# Patient Record
Sex: Male | Born: 1955
Health system: Southern US, Community
[De-identification: ages and names within clinical notes are randomized; demographics above are authoritative.]

## PROBLEM LIST (undated history)

## (undated) DIAGNOSIS — N419 Inflammatory disease of prostate, unspecified: Secondary | ICD-10-CM

## (undated) DIAGNOSIS — T7840XA Allergy, unspecified, initial encounter: Secondary | ICD-10-CM

## (undated) DIAGNOSIS — Z8679 Personal history of other diseases of the circulatory system: Secondary | ICD-10-CM

## (undated) DIAGNOSIS — R300 Dysuria: Secondary | ICD-10-CM

## (undated) DIAGNOSIS — K5090 Crohn's disease, unspecified, without complications: Secondary | ICD-10-CM

## (undated) DIAGNOSIS — M1990 Unspecified osteoarthritis, unspecified site: Secondary | ICD-10-CM

## (undated) DIAGNOSIS — F419 Anxiety disorder, unspecified: Secondary | ICD-10-CM

## (undated) DIAGNOSIS — R002 Palpitations: Secondary | ICD-10-CM

## (undated) DIAGNOSIS — G4700 Insomnia, unspecified: Secondary | ICD-10-CM

## (undated) DIAGNOSIS — I10 Essential (primary) hypertension: Secondary | ICD-10-CM

## (undated) DIAGNOSIS — E7800 Pure hypercholesterolemia, unspecified: Secondary | ICD-10-CM

## (undated) DIAGNOSIS — I861 Scrotal varices: Secondary | ICD-10-CM

## (undated) DIAGNOSIS — N400 Enlarged prostate without lower urinary tract symptoms: Secondary | ICD-10-CM

## (undated) HISTORY — PX: INGUINAL HERNIA REPAIR: SUR1180

## (undated) HISTORY — DX: Scrotal varices: I86.1

## (undated) HISTORY — DX: Inflammatory disease of prostate, unspecified: N41.9

## (undated) HISTORY — DX: Unspecified osteoarthritis, unspecified site: M19.90

## (undated) HISTORY — DX: Personal history of other diseases of the circulatory system: Z86.79

## (undated) HISTORY — DX: Benign prostatic hyperplasia without lower urinary tract symptoms: N40.0

## (undated) HISTORY — DX: Crohn's disease, unspecified, without complications: K50.90

## (undated) HISTORY — DX: Allergy, unspecified, initial encounter: T78.40XA

## (undated) HISTORY — DX: Insomnia, unspecified: G47.00

## (undated) HISTORY — DX: Pure hypercholesterolemia, unspecified: E78.00

## (undated) HISTORY — DX: Essential (primary) hypertension: I10

## (undated) HISTORY — DX: Dysuria: R30.0

## (undated) HISTORY — DX: Anxiety disorder, unspecified: F41.9

## (undated) HISTORY — DX: Palpitations: R00.2

## (undated) HISTORY — PX: ACHILLES TENDON REPAIR: SUR1153

---

## 1997-09-11 ENCOUNTER — Ambulatory Visit (HOSPITAL_BASED_OUTPATIENT_CLINIC_OR_DEPARTMENT_OTHER): Admission: RE | Admit: 1997-09-11 | Discharge: 1997-09-11 | Payer: Self-pay | Admitting: Surgery

## 2004-04-22 ENCOUNTER — Ambulatory Visit (HOSPITAL_COMMUNITY): Admission: RE | Admit: 2004-04-22 | Discharge: 2004-04-22 | Payer: Self-pay | Admitting: *Deleted

## 2004-12-03 ENCOUNTER — Observation Stay (HOSPITAL_COMMUNITY): Admission: EM | Admit: 2004-12-03 | Discharge: 2004-12-04 | Payer: Self-pay

## 2006-01-19 ENCOUNTER — Ambulatory Visit: Payer: Self-pay | Admitting: Family Medicine

## 2006-03-20 ENCOUNTER — Ambulatory Visit: Payer: Self-pay | Admitting: Family Medicine

## 2006-04-12 ENCOUNTER — Ambulatory Visit: Payer: Self-pay | Admitting: Family Medicine

## 2006-08-14 ENCOUNTER — Ambulatory Visit: Payer: Self-pay | Admitting: Family Medicine

## 2006-09-17 ENCOUNTER — Ambulatory Visit: Payer: Self-pay | Admitting: Family Medicine

## 2007-01-16 ENCOUNTER — Encounter: Admission: RE | Admit: 2007-01-16 | Discharge: 2007-01-16 | Payer: Self-pay | Admitting: *Deleted

## 2007-05-16 ENCOUNTER — Ambulatory Visit: Payer: Self-pay | Admitting: Family Medicine

## 2007-09-26 ENCOUNTER — Ambulatory Visit: Payer: Self-pay | Admitting: Family Medicine

## 2007-10-29 ENCOUNTER — Inpatient Hospital Stay (HOSPITAL_COMMUNITY): Admission: EM | Admit: 2007-10-29 | Discharge: 2007-10-31 | Payer: Self-pay | Admitting: Emergency Medicine

## 2007-11-07 ENCOUNTER — Ambulatory Visit: Payer: Self-pay | Admitting: Family Medicine

## 2008-02-07 ENCOUNTER — Ambulatory Visit: Payer: Self-pay | Admitting: Family Medicine

## 2008-03-12 ENCOUNTER — Ambulatory Visit: Payer: Self-pay | Admitting: Family Medicine

## 2008-06-12 ENCOUNTER — Ambulatory Visit: Payer: Self-pay | Admitting: Family Medicine

## 2008-07-10 ENCOUNTER — Ambulatory Visit: Payer: Self-pay | Admitting: Family Medicine

## 2008-07-31 ENCOUNTER — Ambulatory Visit: Payer: Self-pay | Admitting: Family Medicine

## 2008-08-09 LAB — HM COLONOSCOPY: HM Colonoscopy: NORMAL

## 2008-09-15 ENCOUNTER — Ambulatory Visit: Payer: Self-pay | Admitting: Family Medicine

## 2008-11-16 ENCOUNTER — Ambulatory Visit: Payer: Self-pay | Admitting: Family Medicine

## 2008-11-24 ENCOUNTER — Encounter: Admission: RE | Admit: 2008-11-24 | Discharge: 2008-11-24 | Payer: Self-pay | Admitting: Gastroenterology

## 2008-11-30 ENCOUNTER — Ambulatory Visit: Payer: Self-pay | Admitting: Family Medicine

## 2009-01-11 ENCOUNTER — Ambulatory Visit: Payer: Self-pay | Admitting: Family Medicine

## 2009-03-25 ENCOUNTER — Ambulatory Visit: Payer: Self-pay | Admitting: Family Medicine

## 2009-04-20 ENCOUNTER — Ambulatory Visit: Payer: Self-pay | Admitting: Family Medicine

## 2009-05-19 ENCOUNTER — Ambulatory Visit: Payer: Self-pay | Admitting: Family Medicine

## 2009-06-15 ENCOUNTER — Ambulatory Visit: Payer: Self-pay | Admitting: Family Medicine

## 2009-09-20 ENCOUNTER — Ambulatory Visit: Payer: Self-pay | Admitting: Family Medicine

## 2009-09-24 ENCOUNTER — Ambulatory Visit: Payer: Self-pay | Admitting: Family Medicine

## 2009-11-23 ENCOUNTER — Ambulatory Visit: Payer: Self-pay | Admitting: Family Medicine

## 2010-06-15 ENCOUNTER — Other Ambulatory Visit (INDEPENDENT_AMBULATORY_CARE_PROVIDER_SITE_OTHER): Payer: BC Managed Care – PPO

## 2010-06-15 DIAGNOSIS — I10 Essential (primary) hypertension: Secondary | ICD-10-CM

## 2010-06-15 DIAGNOSIS — E785 Hyperlipidemia, unspecified: Secondary | ICD-10-CM

## 2010-07-05 NOTE — H&P (Signed)
NAMEMARQUAN, VOKES NO.:  [id number]   MEDICAL RECORD NO.:  [id number]          PATIENT TYPE:  EMS   LOCATION:  ED                           FACILITY:  [facility]   PHYSICIAN:  Rise Patience, MDDATE OF BIRTH:  [birth year]   DATE OF ADMISSION:  10/29/2007  DATE OF DISCHARGE:                              HISTORY & PHYSICAL   PRIMARY CARE PHYSICIAN:  Dr. Redmond School.   GASTROENTEROLOGIST:  Dr. Lajoyce Corners.   CHIEF COMPLAINT:  Abdominal pain.   HISTORY OF PRESENT ILLNESS:  A 55 year old male with history of Crohn  disease, hypertension, hyperlipidemia, depression.  Presently in the ER  complaining of abdominal pain.  Pain is more in the right lower quadrant  associated some nausea and vomiting over the last 2 days.  The pain is  constant, dull aching, nonradiating, has no associated diarrhea.  Denies  any blood in the vomitus.  Denies any fever, chills.  Denies any chest  pain, shortness of breath, weakness of limbs, loss of consciousness,  dizziness, dysuria, discharges.   PAST MEDICAL HISTORY:  1. Crohn disease.  2. Hypertension.  3. Hyperlipidemia.  4. Depression.   PAST SURGICAL HISTORY:  1. Right-sided surgery for Crohn disease.  Apparently, he he says his      terminal ileum was resected.  2. Inguinal hernia surgery.   MEDICATION PRIOR TO ADMISSION:  1. Asacol.  2. Paxil.  3. Takes blood pressure medicine which he does not remember. He says      it is a beta blocker.  4. Vytorin 10/20 p.o. daily.   ALLERGIES:  NO KNOWN DRUG ALLERGIES.   FAMILY HISTORY:  Noncontributory.   SOCIAL HISTORY:  The patient denies smoking cigarettes, drinking alcohol  or using illegal drugs.   REVIEW OF SYSTEMS:  As in history of present illness, nothing else  significant.   PHYSICAL EXAMINATION:  GENERAL:  The patient examined at bedside, not in  acute distress.  VITAL SIGNS:  Blood pressure is 144/78, pulse 90 per minute, temperature  97.6, respiration 18 per minute, O2  sat 96%.  HEENT:  Anicteric.  No pallor.  CHEST:  Bilateral air entry present.  No rhonchi, no crepitation.  HEART:  S1, S2 heard.  ABDOMEN:  There is mild tenderness in the right lower quadrant.  Bowel  sounds not appreciated.  No guarding or rigidity.  No discoloration.  CNS:  The patient is alert, awake,  and oriented to time, place and  person.  Moves upper and lower extremities 5/5.  EXTREMITIES:  Peripheral pulse are felt, no edema.   LABORATORY DATA:  Acute abdominal series shows mild reactive ileus  versus early small bowel obstruction.  No active cardiopulmonary  disease.  CBC:  WBC 17.1, hemoglobin 15.7, hematocrit 47.2, platelets  241, neutrophils 93%.  Basic metabolic panel:  Sodium 638, potassium 4,  chloride 106, bicarb 24, glucose 124, BUN 15, creatinine 0.9, calcium  9.4.  UA appears cloudy, small bilirubin, ketones 15, blood negative,  nitrites negative, leukocytes negative.   ASSESSMENT:  1. Mild reactive ileus versus small bowel obstruction probably from  Crohn disease exacerbation.  2. Hypertension.  3. Hyperlipidemia.  4. History of depression.  No suicidal ideation.   PLAN:  Admit the patient to medical floor.  Will continue with IV  fluids.  Keep the patient n.p.o.  Will place the patient on IV steroids,  IV antibiotics.  Get blood cultures.  Get a CAT scan of the abdomen and  pelvis and further recommendations as patient evolves.      Rise Patience, MD  Electronically Signed     ANK/MEDQ  D:  10/29/2007  T:  10/29/2007  Job:  [id number]

## 2010-07-07 ENCOUNTER — Other Ambulatory Visit: Payer: Self-pay | Admitting: Family Medicine

## 2010-07-08 NOTE — Assessment & Plan Note (Signed)
Shawn Norris                                 ON-CALL NOTE   Shawn Norris, Shawn Norris                    MRN:          [id number]  DATE:12/05/2008                            DOB:          02/20/1956    PHYSICIAN:  Dr. Carol Ada   Phone number [phone].   TIME:  10:00 a.m.   Shawn Norris calls this morning complaining of episodic abdominal pain  with vomiting for the past 24 hours.  He has a long history of small  bowel Crohn's disease and states he has had a surgical resection in the  past.  He has recently started seeing Dr. Benson Norway and states he underwent a  colonoscopy recently that was unremarkable.  He was maintained on Asacol  and this was stopped about 2 weeks ago.  He had intermittent crampy  abdominal pain associated with nausea and vomiting several times last  night and again this morning.  Right now, he is pain free without  vomiting.  There has been no bleeding, fevers, chills, melena or  hematochezia.  I suggested we evaluate him in an urgent care center or  the emergency room but he would like to treat this as he has in the past  with prednisone.  I have advised him to begin a clear liquid diet and  advance slowly over the next 2-3 days, begin prednisone 40 mg daily  today and Phenergan 12.5/25 mg q.6 h., p.r.n.  He has hydrocodone at  home for pain management.  If his symptoms worsen or do not respond, he  will call me back this weekend.  Otherwise, I have advised him to  contact Dr. Ulyses Amor office on Monday for further management advice.  He  will stay on 40 mg of prednisone until he has further direction from Dr.  Benson Norway.     Pricilla Riffle. Fuller Plan, MD, [facility]  Electronically Signed    MTS/MedQ  DD: 12/05/2008  DT: 12/05/2008  Job #: [id number]   cc:   Tory Emerald. Benson Norway, MD

## 2010-07-08 NOTE — Op Note (Signed)
NAMEJAIMESON, GOPAL              ACCOUNT NO.:  [id number]   MEDICAL RECORD NO.:  [id number]          PATIENT TYPE:  AMB   LOCATION:  ENDO                         FACILITY:  [facility]   PHYSICIAN:  Waverly Ferrari, M.D.    DATE OF BIRTH:  1955/11/04   DATE OF PROCEDURE:  DATE OF DISCHARGE:                                 OPERATIVE REPORT   PROCEDURE:  Colonoscopy.   INDICATIONS:  Crohn's disease and colon cancer screening.   ANESTHESIA:  Demerol 80 mg, Versed 9 mg.   PROCEDURE:  With the patient mildly sedated in the left lateral decubitus  position, a rectal examination was performed.  The prostate was normal and  was otherwise a normal rectal examination.  Subsequently, the Olympus  videoscopic colonoscope was inserted into the rectum and passed under direct  vision to the neocecum which was photographed.  The mucosa appeared normal.  From this point, the colonoscope was slowly withdrawn taking circumferential  views of the colonic mucosa stopping only in the rectum which appeared  normal on direct and retroflex views and also normal as we pulled through  the anal canal.  There were no fissures or fistula seen.  The patient's  vital signs and pulse oximeter remained stable.  The patient tolerated the  procedure well.  There were no apparent complications.   FINDINGS:  Normal examination to the neocecum because of his increased risk  of colon cancer.  We would screen him again in approximately three to five  years.      GMO/MEDQ  D:  04/22/2004  T:  04/22/2004  Job:  [id number]   cc:   Dr. Jenny Reichmann ________

## 2010-07-08 NOTE — H&P (Signed)
Shawn Norris, Shawn Norris              ACCOUNT NO.:  [id number]   MEDICAL RECORD NO.:  [id number]          PATIENT TYPE:  OBV   LOCATION:  1827                         FACILITY:  [facility]   PHYSICIAN:  Edythe Lynn, M.D.       DATE OF BIRTH:  07/09/1955   DATE OF ADMISSION:  12/02/2004  DATE OF DISCHARGE:                                HISTORY & PHYSICAL   PRIMARY CARE PHYSICIAN:  Dr. Redmond School   CHIEF COMPLAINT:  Abdominal pain.   HISTORY OF PRESENT ILLNESS:  Shawn Norris is a 55 year old male with long  history of Crohn's disease status post small-bowel resection years ago. The  patient is currently on no maintenance therapy and he has not had a flare in  about 5 years. The patient presents to [facility] emergency room with severe  onset of abdominal pain in the right lower quadrant with some nausea and  inability to pass gas.   PAST MEDICAL HISTORY:  1.  Crohn's disease status post small-bowel resection.  2.  Hypertension.  3.  Status post hernia repair.   FAMILY HISTORY:  Positive for Crohn's disease in a brother. Both parents are  alive and healthy. He has got a grandmother with some colitis.   SOCIAL HISTORY:  The patient does not smoke cigarettes. He occasionally  drinks alcohol. He works in [employer]. He is single, has no children.   REVIEW OF SYSTEMS:  Twelve systems tested and negative.   HOME MEDICATIONS:  1.  Diltiazem 360 mg per mouth daily.  2.  Paxil 10 mg per mouth daily.   PHYSICAL EXAMINATION UPON ADMISSION:  VITAL SIGNS:  Temperature of 97.6,  blood pressure 154/95, pulse 101, respirations 20, saturation 98% on room  air.  GENERAL:  This gentleman is well-developed, well-nourished, in no acute  distress.  HEENT:  Head is normocephalic, atraumatic. Eyes have pupils equal and round,  reactive to light.  NECK:  Supple without JVD.  LUNGS:  Clear to auscultation without wheezes, rhonchi, or crackles.  HEART:  Regular rate and rhythm without murmurs, rubs, or gallops.  ABDOMEN:  Soft, nontender, not distended. Bowel sounds are present. There is  some mild tenderness to deep palpation of the right lower quadrant but there  is no guarding and no rebound.  SKIN:  Warm and dry.  NEUROLOGIC:  Nonfocal. The patient appears to have intact insight, judgment,  memory, and affect.   LABORATORY VALUES AT THE TIME OF ADMISSION:  Show a white blood cell count  of 12,900; hemoglobin of 16.4; hematocrit 47.5; platelet count of 246;  absolute neutrophil count 11.6. Sodium 139, potassium 2.9, chloride 104,  bicarb 26, glucose 138, BUN of 14, creatinine 1.2, calcium 9.6, protein 7.2,  albumin 4.3, AST 32, ALT 25, alkaline phosphatase 45, bilirubin 0.7, lipase  16. CT scan of the abdomen with oral and IV contrast shows thickening of the  distal ileum and portion of the small bowel, no abscess, and no small-bowel  obstruction.   ASSESSMENT AND PLAN:  1.  Mild Crohn's disease flare. The plan is to start the patient on a taper  of prednisone orally and mesalamine at 4 g daily. If the patient      tolerates the regimen well today he is going to be able to be discharged      and to be followed up by Dr. Redmond School.  2.  Hypertension. Will continue the patient's Cardizem.  3.  Deep venous thrombosis and gastrointestinal prophylaxis will be      undertaken.      Edythe Lynn, M.D.  Electronically Signed     SL/MEDQ  D:  12/03/2004  T:  12/03/2004  Job:  [id number]   cc:   Jill Alexanders, M.D.  Fax: [phone]

## 2010-08-10 ENCOUNTER — Other Ambulatory Visit: Payer: Self-pay | Admitting: Family Medicine

## 2010-11-11 ENCOUNTER — Other Ambulatory Visit: Payer: Self-pay | Admitting: Family Medicine

## 2010-11-11 MED ORDER — ZOLPIDEM TARTRATE 10 MG PO TABS: 10.0000 mg | ORAL_TABLET | Freq: Every evening | ORAL | Status: DC | PRN

## 2010-11-11 NOTE — Telephone Encounter (Signed)
Called in Ambien 10 mg 1 po QHS prn #30 with no refills to [facility] at [phone] per JCL.  CM, LPN

## 2010-11-11 NOTE — Telephone Encounter (Signed)
Is this ok?

## 2010-11-11 NOTE — Telephone Encounter (Signed)
Call in the Ambien 

## 2010-11-23 LAB — BASIC METABOLIC PANEL
BUN: 12
BUN: 15
CO2: 24
CO2: 29
Calcium: 8.6
Calcium: 9.4
Chloride: 106
Chloride: 109
Creatinine, Ser: 0.95
Creatinine, Ser: 0.96
GFR calc Af Amer: >60
GFR calc Af Amer: >60
GFR calc non Af Amer: >60
GFR calc non Af Amer: >60
Glucose, Bld: 118 — ABNORMAL HIGH
Glucose, Bld: 124 — ABNORMAL HIGH
Potassium: 3.8
Potassium: 4
Sodium: 139
Sodium: 142

## 2010-11-23 LAB — URINALYSIS, ROUTINE W REFLEX MICROSCOPIC
Glucose, UA: NEGATIVE
Hgb urine dipstick: NEGATIVE
Ketones, ur: 15 — AB
Nitrite: NEGATIVE
Protein, ur: NEGATIVE
Specific Gravity, Urine: 1.03
Urobilinogen, UA: 0.2
pH: 5

## 2010-11-23 LAB — DIFFERENTIAL
Basophils Absolute: 0.1
Basophils Relative: 0
Eosinophils Absolute: 0
Eosinophils Relative: 0
Lymphocytes Relative: 4 — ABNORMAL LOW
Lymphs Abs: 0.7
Monocytes Absolute: 0.4
Monocytes Relative: 2 — ABNORMAL LOW
Neutro Abs: 15.9 — ABNORMAL HIGH
Neutrophils Relative %: 93 — ABNORMAL HIGH

## 2010-11-23 LAB — CBC
HCT: 38 — ABNORMAL LOW
HCT: 38.4 — ABNORMAL LOW
HCT: 43.4
HCT: 47.2
Hemoglobin: 12.9 — ABNORMAL LOW
Hemoglobin: 13
Hemoglobin: 14.8
Hemoglobin: 15.7
MCHC: 33.3
MCHC: 33.8
MCHC: 33.8
MCHC: 34
MCV: 88.8
MCV: 89.3
MCV: 89.4
MCV: 90
Platelets: 194
Platelets: 198
Platelets: 217
Platelets: 241
RBC: 4.25
RBC: 4.33
RBC: 4.85
RBC: 5.24
RDW: 12.6
RDW: 13.1
RDW: 13.1
RDW: 13.3
WBC: 14.3 — ABNORMAL HIGH
WBC: 17.1 — ABNORMAL HIGH
WBC: 6.7
WBC: 7.4

## 2010-11-23 LAB — COMPREHENSIVE METABOLIC PANEL
ALT: 14
AST: 18
Albumin: 3.5
Alkaline Phosphatase: 37 — ABNORMAL LOW
BUN: 12
CO2: 25
Calcium: 8.7
Chloride: 104
Creatinine, Ser: 0.89
GFR calc Af Amer: >60
GFR calc non Af Amer: >60
Glucose, Bld: 113 — ABNORMAL HIGH
Potassium: 3.8
Sodium: 136
Total Bilirubin: 0.9
Total Protein: 6.7

## 2010-11-23 LAB — CULTURE, BLOOD (ROUTINE X 2)
Culture: NO GROWTH
Culture: NO GROWTH

## 2010-11-23 LAB — GLUCOSE, CAPILLARY
Glucose-Capillary: 107 — ABNORMAL HIGH
Glucose-Capillary: 112 — ABNORMAL HIGH
Glucose-Capillary: 124 — ABNORMAL HIGH
Glucose-Capillary: 131 — ABNORMAL HIGH
Glucose-Capillary: 146 — ABNORMAL HIGH

## 2010-11-23 LAB — LIPASE, BLOOD: Lipase: 17

## 2010-12-19 ENCOUNTER — Other Ambulatory Visit (INDEPENDENT_AMBULATORY_CARE_PROVIDER_SITE_OTHER): Payer: BC Managed Care – PPO

## 2010-12-19 ENCOUNTER — Telehealth: Payer: Self-pay | Admitting: Family Medicine

## 2010-12-19 DIAGNOSIS — Z2911 Encounter for prophylactic immunotherapy for respiratory syncytial virus (RSV): Secondary | ICD-10-CM

## 2010-12-19 DIAGNOSIS — Z23 Encounter for immunization: Secondary | ICD-10-CM

## 2010-12-19 NOTE — Progress Notes (Signed)
Addended by: Christin Bach L on: 12/19/2010 03:03 PM   Modules accepted: Orders

## 2010-12-19 NOTE — Telephone Encounter (Signed)
Spoke to Anderson @ [place] today. Patient wanted to get shingles vaccine. Per Jasmine @ [employer] Pt has blue advantage PPO plan,. Non grand fathered plan, preventative services covered at 100%, Shingles vaccine [phone] is covered. No age requirements, pt received vaccine today.

## 2011-02-07 ENCOUNTER — Other Ambulatory Visit: Payer: Self-pay | Admitting: Family Medicine

## 2011-02-07 NOTE — Telephone Encounter (Signed)
Called ambien in [place] sent in the other

## 2011-02-07 NOTE — Telephone Encounter (Signed)
Is this ok?

## 2011-02-07 NOTE — Telephone Encounter (Signed)
Called med in

## 2011-02-23 ENCOUNTER — Encounter (INDEPENDENT_AMBULATORY_CARE_PROVIDER_SITE_OTHER): Payer: Self-pay | Admitting: Surgery

## 2011-02-24 ENCOUNTER — Encounter (INDEPENDENT_AMBULATORY_CARE_PROVIDER_SITE_OTHER): Payer: Self-pay | Admitting: Surgery

## 2011-02-25 ENCOUNTER — Other Ambulatory Visit: Payer: Self-pay | Admitting: Family Medicine

## 2011-02-25 MED ORDER — AZATHIOPRINE 50 MG PO TABS: 50.0000 mg | ORAL_TABLET | Freq: Two times a day (BID) | ORAL | Status: DC

## 2011-03-14 ENCOUNTER — Encounter (INDEPENDENT_AMBULATORY_CARE_PROVIDER_SITE_OTHER): Payer: Self-pay

## 2011-03-17 ENCOUNTER — Encounter (INDEPENDENT_AMBULATORY_CARE_PROVIDER_SITE_OTHER): Payer: Self-pay | Admitting: Surgery

## 2011-03-24 ENCOUNTER — Ambulatory Visit (INDEPENDENT_AMBULATORY_CARE_PROVIDER_SITE_OTHER): Payer: BC Managed Care – PPO | Admitting: Surgery

## 2011-03-24 DIAGNOSIS — K4090 Unilateral inguinal hernia, without obstruction or gangrene, not specified as recurrent: Secondary | ICD-10-CM

## 2011-03-24 NOTE — Progress Notes (Signed)
Re:   Shawn Norris DOB:   Jul 05, 1955 MRN:   [id number]  ASSESSMENT AND PLAN: 1.  Left inguinal hernia.  No change in physical exam over the last year.  (I have several notes in the old paper chart.)  The hernia bothers him a little more, especially when he plays golf.  To return to office on a PRN basis.  If he decides to schedule surgery, I would want to see him back in the office if it is more than 6 months from this appointment.  But the surgery can be scheduled first and I can see him a week or so before the surgery.  I also would be happy to see him back in 1 year.  I discussed the indications and complications of hernia surgery with the patient.  I discussed both the laparoscopic and open approach to hernia repair. He is not a candidate for laparoscopic repair because of his prior abdominal surgery.  The potential risks of hernia surgery include, but are not limited to, bleeding, infection, open surgery, nerve injury, and recurrence of the hernia.  I have provided the patient literature about hernia surgery.  2.  History of anxiety.  On Paxil. 3.  History of Crohn's disease. Well controlled on Imuran. Sees Dr. Benny Lennert. 4.  HTN. 5.  Hypercholesterolemia. 6.  BPH. Chronic prostatitis. Sees Dr. Keene Breath.  This is actually under good control. 7.  Left scrotal varicocele. 8.  He has had trouble with his eyes during the last year, "hole" in retina and bleed in retina. 9.  Pain posterior to left hip/buttocks muscle, I think unrelated to left inguinal hernia.  REFERRING PHYSICIAN: Wyatt Haste, MD, MD  HISTORY OF PRESENT ILLNESS: Shawn Norris (goes by "Shawn Norris") is a 56 y.o. (DOB: 17-Oct-1955)  white male whose primary care physician is Wyatt Haste, MD, MD and comes to me today for re-evaluate left inguinal hernia.  I have seen the patient several times for a small left inguinal hernia. I repaired a recurrent right inguinal hernia on him in July of 1999. He has done  well from this repair. He also has the scars of the lower midline abdominal incision and had an appendectomy.  Since I saw him last year the hernia bothers him a little more, particularly when he plays golf. He has not noticed a change in the size of the hernia. It actually his main complaint is pain posterior on the left side at the top of his gluteus maximus. I think this is unrelated to his hernia. I have office notes from Dr. Irine Seal dated 13 March 2011.  We talked for a while about continued observation versus proceeding with surgery for this left inguinal hernia.   Past Medical History  Diagnosis Date  . BPH (benign prostatic hypertrophy)   . Inguinal hernia   . Prostatitis   . Varicocele   . Anxiety   . Arthritis   . Crohn's   . Dysuria   . Hypercholesterolemia   . Hypertension       Past Surgical History  Procedure Date  . Inguinal hernia repair   . Achilles tendon repair       Current Outpatient Prescriptions  Medication Sig Dispense Refill  . azaTHIOprine (IMURAN) 50 MG tablet Take 1 tablet (50 mg total) by mouth 2 (two) times daily.  60 tablet  0  . fluticasone (FLONASE) 50 MCG/ACT nasal spray USE 2 SPRAYS IN BOTH NOSTRILS ONCE A DAY  17 g  11  . LORazepam (ATIVAN) 0.5 MG tablet TAKE 1 TABLET BY MOUTH EVERY DAY AS NEEDED  30 tablet  0  . PARoxetine (PAXIL) 10 MG tablet TAKE ONE TABLET BY MOUTH DAILY  90 tablet  3  . simvastatin (ZOCOR) 40 MG tablet TAKE ONE TABLET BY MOUTH DAILY  90 tablet  3  . zolpidem (AMBIEN) 10 MG tablet TAKE 1 TABLET BY MOUTH EVERY NIGHT AT BEDTIME AS NEEDED FOR SLEEP  30 tablet  0      Allergies  Allergen Reactions  . Penicillins Rash    REVIEW OF SYSTEMS: Skin:  No history of rash.  No history of abnormal moles. Infection:  No history of hepatitis or HIV.  No history of MRSA. Neurologic:  No history of stroke.  No history of seizure.  No history of headaches. Cardiac:  Hypertension.. No history of heart disease.  No history of  prior cardiac catheterization. Pulmonary:  Does not smoke cigarettes.  No asthma or bronchitis.  No OSA/CPAP.  Endocrine:  No diabetes. No thyroid disease. Gastrointestinal:  History of Crohn's disease.  Followed by Dr. Benny Lennert.  Prior history of multiple abdominal operations. Urologic:  Sees Dr. Jeffie Pollock for BPH and chronic prostatitis. Musculoskeletal:  No history of joint or back disease. Hematologic:  No bleeding disorder.  No history of anemia.  Not anticoagulated. Psycho-social:  The patient is oriented.   The patient has no obvious psychologic or social impairment to understanding our conversation and plan. Eyes:  He has had trouble with both eyes over the last year.  He has had a hole in his retina, some bleeding in his retina, and bilateral cataract surgery.  PHYSICAL EXAM: BP 118/70  Pulse 78  Resp 18  Wt 152 lb (68.947 kg)  General: WN thin WM who is alert and generally healthy appearing.  HEENT: Normal. Pupils equal. Good dentition. Neck: Supple. No mass.  No thyroid mass.  Carotid pulse okay with no bruit. Lymph Nodes:  No supraclavicular or cervical nodes. Lungs: Clear to auscultation and symmetric breath sounds. Heart:  RRR. No murmur or rub.  Abdomen: He has a midline scar.  A right LQ scar (from appendectomy).  A right inguinal scar from prior right inguinal hernia.  He has a small left inguinal hernia, which I think is unchanged over the last year.  He has a left scrotal varicocele. Rectal: Not done. Extremities:  Good strength and ROM  in upper and lower extremities. Neurologic:  Grossly intact to motor and sensory function. Psychiatric: Has normal mood and affect. Behavior is normal.   DATA REVIEWED: Notes from Dr. Enis Slipper, MD,  [facility], [address]  [place], [place]    [place] Phone:  [phone] FAX:  [phone]

## 2011-03-28 ENCOUNTER — Other Ambulatory Visit: Payer: Self-pay | Admitting: Family Medicine

## 2011-03-28 NOTE — Telephone Encounter (Signed)
Is this ok?

## 2011-04-14 ENCOUNTER — Telehealth: Payer: Self-pay | Admitting: *Deleted

## 2011-04-14 ENCOUNTER — Other Ambulatory Visit: Payer: Self-pay | Admitting: Family Medicine

## 2011-04-14 NOTE — Telephone Encounter (Signed)
Called in Ambien 36m #30 qhs for sleep no refill, [facility] [place]

## 2011-04-14 NOTE — Telephone Encounter (Signed)
This was in the refill requests. Is this okay to refill?

## 2011-04-14 NOTE — Telephone Encounter (Signed)
Renew this med

## 2011-05-18 ENCOUNTER — Other Ambulatory Visit: Payer: Self-pay | Admitting: Family Medicine

## 2011-05-18 NOTE — Telephone Encounter (Signed)
Is this ok?

## 2011-05-19 NOTE — Telephone Encounter (Signed)
OK to renew

## 2011-05-22 ENCOUNTER — Other Ambulatory Visit: Payer: Self-pay

## 2011-05-22 MED ORDER — ZOLPIDEM TARTRATE 10 MG PO TABS: 10.0000 mg | ORAL_TABLET | Freq: Every evening | ORAL | Status: DC | PRN

## 2011-05-22 NOTE — Telephone Encounter (Signed)
Called med in per [employer]

## 2011-06-29 ENCOUNTER — Telehealth: Payer: Self-pay | Admitting: Family Medicine

## 2011-07-03 ENCOUNTER — Other Ambulatory Visit: Payer: Self-pay | Admitting: Family Medicine

## 2011-07-03 ENCOUNTER — Ambulatory Visit (INDEPENDENT_AMBULATORY_CARE_PROVIDER_SITE_OTHER): Payer: BC Managed Care – PPO | Admitting: Family Medicine

## 2011-07-03 ENCOUNTER — Encounter: Payer: Self-pay | Admitting: Family Medicine

## 2011-07-03 VITALS — BP 126/80 | HR 76 | Wt 142.0 lb

## 2011-07-03 DIAGNOSIS — I471 Supraventricular tachycardia, unspecified: Secondary | ICD-10-CM

## 2011-07-03 DIAGNOSIS — F419 Anxiety disorder, unspecified: Secondary | ICD-10-CM

## 2011-07-03 DIAGNOSIS — I10 Essential (primary) hypertension: Secondary | ICD-10-CM

## 2011-07-03 DIAGNOSIS — K5090 Crohn's disease, unspecified, without complications: Secondary | ICD-10-CM

## 2011-07-03 DIAGNOSIS — H33039 Retinal detachment with giant retinal tear, unspecified eye: Secondary | ICD-10-CM

## 2011-07-03 DIAGNOSIS — Z79899 Other long term (current) drug therapy: Secondary | ICD-10-CM

## 2011-07-03 DIAGNOSIS — E785 Hyperlipidemia, unspecified: Secondary | ICD-10-CM | POA: Insufficient documentation

## 2011-07-03 DIAGNOSIS — H33311 Horseshoe tear of retina without detachment, right eye: Secondary | ICD-10-CM

## 2011-07-03 DIAGNOSIS — F411 Generalized anxiety disorder: Secondary | ICD-10-CM

## 2011-07-03 LAB — COMPREHENSIVE METABOLIC PANEL
ALT: 11 U/L (ref 0–53)
AST: 20 U/L (ref 0–37)
Albumin: 4.6 g/dL (ref 3.5–5.2)
Alkaline Phosphatase: 31 U/L — ABNORMAL LOW (ref 39–117)
BUN: 20 mg/dL (ref 6–23)
CO2: 27 mEq/L (ref 19–32)
Calcium: 9.5 mg/dL (ref 8.4–10.5)
Chloride: 104 mEq/L (ref 96–112)
Creat: 0.9 mg/dL (ref 0.50–1.35)
Glucose, Bld: 84 mg/dL (ref 70–99)
Potassium: 4.3 mEq/L (ref 3.5–5.3)
Sodium: 141 mEq/L (ref 135–145)
Total Bilirubin: 0.7 mg/dL (ref 0.3–1.2)
Total Protein: 7 g/dL (ref 6.0–8.3)

## 2011-07-03 LAB — CBC WITH DIFFERENTIAL/PLATELET
Basophils Absolute: 0 10*3/uL (ref 0.0–0.1)
Basophils Relative: 1 % (ref 0–1)
Eosinophils Absolute: 0 10*3/uL (ref 0.0–0.7)
Eosinophils Relative: 1 % (ref 0–5)
HCT: 44.2 % (ref 39.0–52.0)
Hemoglobin: 14.8 g/dL (ref 13.0–17.0)
Lymphocytes Relative: 25 % (ref 12–46)
Lymphs Abs: 0.9 10*3/uL (ref 0.7–4.0)
MCH: 29.8 pg (ref 26.0–34.0)
MCHC: 33.5 g/dL (ref 30.0–36.0)
MCV: 88.9 fL (ref 78.0–100.0)
Monocytes Absolute: 0.3 10*3/uL (ref 0.1–1.0)
Monocytes Relative: 9 % (ref 3–12)
Neutro Abs: 2.4 10*3/uL (ref 1.7–7.7)
Neutrophils Relative %: 64 % (ref 43–77)
Platelets: 195 10*3/uL (ref 150–400)
RBC: 4.97 MIL/uL (ref 4.22–5.81)
RDW: 12.7 % (ref 11.5–15.5)
WBC: 3.7 10*3/uL — ABNORMAL LOW (ref 4.0–10.5)

## 2011-07-03 LAB — LIPID PANEL
Cholesterol: 152 mg/dL (ref 0–200)
HDL: 60 mg/dL (ref >39)
LDL Cholesterol: 82 mg/dL (ref 0–99)
Total CHOL/HDL Ratio: 2.5 Ratio
Triglycerides: 48 mg/dL (ref <150)
VLDL: 10 mg/dL (ref 0–40)

## 2011-07-03 NOTE — Progress Notes (Signed)
  Subjective:    Patient ID: Shawn Norris, male    DOB: [birth year], 56 y.o.   MRN: [id number]  HPI He is here for medication recheck. He does have a history of PSVT and has not had any symptoms of that recently. He continues on his blood pressure medication as well as Zocor and is doing well on them. He is interested in tapering off his Paxil. He has been under stress recently due to difficulty with a very close friend. Recently he did have a retinal tear with some bleeding. He is now having floaters from this and is considering surgery for this. He also is had some left inguinal and pelvic pain. He does have a history of varicocele and questionable history of hernia on that side. He does note that the pain is worse with certain maneuvers. He does have underlying Crohn's disease and is under good control at this time. He does followup with his gastroenterologist on a regular basis.  Review of Systems Negative except as above    Objective:   Physical Exam Alert and in no distress. Cardiac exam shows regular rhythm without murmurs or gallops. Lungs clear to auscultation. Genital exam does show a left varicocele. No hernias appreciated. Slight tenderness to palpation in the upper left inguinal area.       Assessment & Plan:   1. PSVT (paroxysmal supraventricular tachycardia)    2. Hypertension    3. Hyperlipidemia LDL goal < 100  Lipid panel  4. Crohn's disease  CBC with Differential, Comprehensive metabolic panel  5. Encounter for long-term (current) use of other medications  CBC with Differential, Comprehensive metabolic panel, Lipid panel  6. Anxiety    7. Retinal tear, right    8  left inguinal pain, etiology unclear. Recommend he followup with his surgeon. Discussed distress and he is under. He seems to be handling this very well. He will continue on Paxil and use Ativan as needed as well as his Ambien which he uses also very sparingly to

## 2011-07-04 LAB — PSA: PSA: 0.82 ng/mL (ref <=4.00)

## 2011-07-04 MED ORDER — PAROXETINE HCL 10 MG PO TABS: 10.0000 mg | ORAL_TABLET | ORAL | Status: DC

## 2011-07-04 MED ORDER — FLUTICASONE PROPIONATE 50 MCG/ACT NA SUSP: 2.0000 | Freq: Every day | NASAL | Status: DC

## 2011-07-04 MED ORDER — LORAZEPAM 0.5 MG PO TABS: 0.5000 mg | ORAL_TABLET | Freq: Four times a day (QID) | ORAL | Status: DC | PRN

## 2011-07-04 MED ORDER — SIMVASTATIN 40 MG PO TABS: 40.0000 mg | ORAL_TABLET | Freq: Every day | ORAL | Status: DC

## 2011-07-04 NOTE — Telephone Encounter (Signed)
done

## 2011-07-04 NOTE — Progress Notes (Signed)
Addended by: Denita Lung on: 07/04/2011 10:55 AM   Modules accepted: Orders

## 2011-08-11 ENCOUNTER — Other Ambulatory Visit: Payer: Self-pay | Admitting: Family Medicine

## 2011-08-11 NOTE — Telephone Encounter (Signed)
RX REFILL

## 2011-08-11 NOTE — Telephone Encounter (Signed)
AMBIEN 10 MG WAS CALLED OUT TO THE PHARMACY PER DAVID SHANE TYSINGER PA-C. CLS

## 2011-09-11 ENCOUNTER — Ambulatory Visit (INDEPENDENT_AMBULATORY_CARE_PROVIDER_SITE_OTHER): Payer: BC Managed Care – PPO | Admitting: Family Medicine

## 2011-09-11 ENCOUNTER — Encounter: Payer: Self-pay | Admitting: Family Medicine

## 2011-09-11 VITALS — BP 118/70 | HR 90 | Temp 98.4°F | Wt 143.0 lb

## 2011-09-11 DIAGNOSIS — J209 Acute bronchitis, unspecified: Secondary | ICD-10-CM

## 2011-09-11 DIAGNOSIS — J019 Acute sinusitis, unspecified: Secondary | ICD-10-CM

## 2011-09-11 MED ORDER — AZITHROMYCIN 500 MG PO TABS: 500.0000 mg | ORAL_TABLET | Freq: Every day | ORAL | Status: AC

## 2011-09-11 NOTE — Progress Notes (Signed)
  Subjective:    Patient ID: Shawn Norris, male    DOB: Oct 16, 1955, 56 y.o.   MRN: [id number]  HPI He has a five-day history this started with sinus pressure followed by sore throat, chest congestion and now has a productive cough with myalgia and PND. He also has a hoarse voice but no sore throat, earache. He does not smoke. He has no underlying allergies. He continues on medications listed in the chart. He did have one episode of diarrhea but otherwise has been doing fairly well. He's had no chest discomfort.   Review of Systems     Objective:   Physical Exam alert and in no distress. Tympanic membranes and canals are normal. Throat is clear. Tonsils are normal. Neck is supple without adenopathy or thyromegaly. Cardiac exam shows a regular sinus rhythm without murmurs or gallops. Lungs are clear to auscultation. Nasal mucosa is slightly red with tenderness over all sinuses       Assessment & Plan:   1. Sinusitis acute  azithromycin (ZITHROMAX) 500 MG tablet  2. Acute bronchitis  azithromycin (ZITHROMAX) 500 MG tablet   history of Crohn's disease. Instructed him on proper use of the azithromycin. He will take another course if after 7-10 days he is still having symptoms.

## 2011-09-11 NOTE — Patient Instructions (Signed)
If you're not fully back to normal after 7-10 days take a refill

## 2011-10-26 ENCOUNTER — Other Ambulatory Visit: Payer: Self-pay | Admitting: Medical

## 2011-10-26 NOTE — Telephone Encounter (Signed)
Renew this

## 2011-10-26 NOTE — Telephone Encounter (Signed)
CALLED MED IN

## 2011-10-26 NOTE — Telephone Encounter (Signed)
IS THIS OK

## 2011-11-27 ENCOUNTER — Other Ambulatory Visit (INDEPENDENT_AMBULATORY_CARE_PROVIDER_SITE_OTHER): Payer: BC Managed Care – PPO

## 2011-11-27 DIAGNOSIS — Z23 Encounter for immunization: Secondary | ICD-10-CM

## 2011-12-26 ENCOUNTER — Ambulatory Visit (INDEPENDENT_AMBULATORY_CARE_PROVIDER_SITE_OTHER): Payer: BC Managed Care – PPO | Admitting: Family Medicine

## 2011-12-26 ENCOUNTER — Telehealth: Payer: Self-pay | Admitting: Family Medicine

## 2011-12-26 ENCOUNTER — Encounter: Payer: Self-pay | Admitting: Family Medicine

## 2011-12-26 ENCOUNTER — Other Ambulatory Visit: Payer: BC Managed Care – PPO

## 2011-12-26 VITALS — BP 138/86 | HR 96 | Wt 150.0 lb

## 2011-12-26 DIAGNOSIS — I10 Essential (primary) hypertension: Secondary | ICD-10-CM

## 2011-12-26 DIAGNOSIS — I471 Supraventricular tachycardia: Secondary | ICD-10-CM

## 2011-12-26 NOTE — Progress Notes (Signed)
  Subjective:    Patient ID: Shawn Norris, male    DOB: Feb 07, 1956, 56 y.o.   MRN: [id number]  HPI He is here for consultation. He seen recently by his cardiologist and his blood pressure was elevated. He is here for followup. He presently is on 360 mg of verapamil to help with blood pressure as well as PSVT.   Review of Systems     Objective:   Physical Exam Alert and in no distress. Blood pressure is still slightly elevated.       Assessment & Plan:   1. Hypertension   2. PSVT (paroxysmal supraventricular tachycardia)    we discussed options. He would like to wait another month and check his pressure then. He'll continue with his physical activity level.

## 2012-01-04 NOTE — Telephone Encounter (Signed)
tsd

## 2012-01-10 ENCOUNTER — Ambulatory Visit (INDEPENDENT_AMBULATORY_CARE_PROVIDER_SITE_OTHER): Payer: BC Managed Care – PPO | Admitting: Family Medicine

## 2012-01-10 ENCOUNTER — Encounter: Payer: Self-pay | Admitting: Family Medicine

## 2012-01-10 VITALS — BP 130/80 | HR 116 | Wt 146.0 lb

## 2012-01-10 DIAGNOSIS — B029 Zoster without complications: Secondary | ICD-10-CM

## 2012-01-10 MED ORDER — VALACYCLOVIR HCL 1 G PO TABS: 1000.0000 mg | ORAL_TABLET | Freq: Three times a day (TID) | ORAL | Status: DC

## 2012-01-10 NOTE — Patient Instructions (Signed)

## 2012-01-10 NOTE — Progress Notes (Signed)
  Subjective:    Patient ID: Shawn Norris, male    DOB: January 09, 1956, 56 y.o.   MRN: [id number]  HPI Over the last 2 days he has noted burning and tingling sensation in the right. Umbilical area. He does have a previous history of shingles vaccination. He noted the 3 cm area of erythema and tenderness today and is here for evaluation.   Review of Systems     Objective:   Physical Exam A 3 cm area of slight erythema but no vesicles noted in the right periumbilical area. No other lesions are noted.       Assessment & Plan:   1. Shingles  valACYclovir (VALTREX) 1000 MG tablet   I discussed shingles with him. Gave him information concerning this. Discussed treatment of the pain with anti-inflammatories and possibly Vicodin if needed. He will keep in touch with me concerning this.

## 2012-01-16 ENCOUNTER — Other Ambulatory Visit: Payer: Self-pay | Admitting: Family Medicine

## 2012-01-16 NOTE — Telephone Encounter (Signed)
Is this ok?

## 2012-01-16 NOTE — Telephone Encounter (Signed)
CALLED IN [facility]

## 2012-01-16 NOTE — Telephone Encounter (Signed)
Renew this

## 2012-03-27 ENCOUNTER — Telehealth: Payer: Self-pay | Admitting: Internal Medicine

## 2012-03-27 MED ORDER — PAROXETINE HCL 10 MG PO TABS: 10.0000 mg | ORAL_TABLET | ORAL | Status: DC

## 2012-03-27 NOTE — Telephone Encounter (Signed)
Paxil renewed

## 2012-03-28 ENCOUNTER — Other Ambulatory Visit: Payer: Self-pay | Admitting: Family Medicine

## 2012-03-29 NOTE — Telephone Encounter (Signed)
Called ambien in per [employer]

## 2012-03-29 NOTE — Telephone Encounter (Signed)
Is this okay?

## 2012-03-29 NOTE — Telephone Encounter (Signed)
Okay to refill? 

## 2012-05-20 ENCOUNTER — Ambulatory Visit (INDEPENDENT_AMBULATORY_CARE_PROVIDER_SITE_OTHER): Payer: BC Managed Care – PPO | Admitting: Family Medicine

## 2012-05-20 ENCOUNTER — Encounter: Payer: Self-pay | Admitting: Family Medicine

## 2012-05-20 VITALS — HR 100 | Ht 66.0 in | Wt 144.0 lb

## 2012-05-20 DIAGNOSIS — I861 Scrotal varices: Secondary | ICD-10-CM

## 2012-05-20 DIAGNOSIS — F341 Dysthymic disorder: Secondary | ICD-10-CM

## 2012-05-20 DIAGNOSIS — I471 Supraventricular tachycardia: Secondary | ICD-10-CM

## 2012-05-20 DIAGNOSIS — E785 Hyperlipidemia, unspecified: Secondary | ICD-10-CM

## 2012-05-20 DIAGNOSIS — K5090 Crohn's disease, unspecified, without complications: Secondary | ICD-10-CM

## 2012-05-20 DIAGNOSIS — Z0000 Encounter for general adult medical examination without abnormal findings: Secondary | ICD-10-CM

## 2012-05-20 DIAGNOSIS — J309 Allergic rhinitis, unspecified: Secondary | ICD-10-CM

## 2012-05-20 DIAGNOSIS — I10 Essential (primary) hypertension: Secondary | ICD-10-CM

## 2012-05-20 LAB — LIPID PANEL
Cholesterol: 157 mg/dL (ref 0–200)
HDL: 60 mg/dL (ref >39)
LDL Cholesterol: 84 mg/dL (ref 0–99)
Total CHOL/HDL Ratio: 2.6 Ratio
Triglycerides: 67 mg/dL (ref <150)
VLDL: 13 mg/dL (ref 0–40)

## 2012-05-20 LAB — CBC WITH DIFFERENTIAL/PLATELET
Basophils Absolute: 0 10*3/uL (ref 0.0–0.1)
Basophils Relative: 1 % (ref 0–1)
Eosinophils Absolute: 0.1 10*3/uL (ref 0.0–0.7)
Eosinophils Relative: 2 % (ref 0–5)
HCT: 43.3 % (ref 39.0–52.0)
Hemoglobin: 14.7 g/dL (ref 13.0–17.0)
Lymphocytes Relative: 29 % (ref 12–46)
Lymphs Abs: 1.1 10*3/uL (ref 0.7–4.0)
MCH: 30.1 pg (ref 26.0–34.0)
MCHC: 33.9 g/dL (ref 30.0–36.0)
MCV: 88.5 fL (ref 78.0–100.0)
Monocytes Absolute: 0.3 10*3/uL (ref 0.1–1.0)
Monocytes Relative: 9 % (ref 3–12)
Neutro Abs: 2.4 10*3/uL (ref 1.7–7.7)
Neutrophils Relative %: 59 % (ref 43–77)
Platelets: 212 10*3/uL (ref 150–400)
RBC: 4.89 MIL/uL (ref 4.22–5.81)
RDW: 13.6 % (ref 11.5–15.5)
WBC: 3.9 10*3/uL — ABNORMAL LOW (ref 4.0–10.5)

## 2012-05-20 LAB — COMPREHENSIVE METABOLIC PANEL
ALT: 21 U/L (ref 0–53)
AST: 22 U/L (ref 0–37)
Albumin: 4.5 g/dL (ref 3.5–5.2)
Alkaline Phosphatase: 27 U/L — ABNORMAL LOW (ref 39–117)
BUN: 15 mg/dL (ref 6–23)
CO2: 29 mEq/L (ref 19–32)
Calcium: 9.2 mg/dL (ref 8.4–10.5)
Chloride: 102 mEq/L (ref 96–112)
Creat: 0.96 mg/dL (ref 0.50–1.35)
Glucose, Bld: 83 mg/dL (ref 70–99)
Potassium: 3.4 mEq/L — ABNORMAL LOW (ref 3.5–5.3)
Sodium: 140 mEq/L (ref 135–145)
Total Bilirubin: 0.7 mg/dL (ref 0.3–1.2)
Total Protein: 6.9 g/dL (ref 6.0–8.3)

## 2012-05-20 LAB — HEMOCCULT GUIAC POC 1CARD (OFFICE)

## 2012-05-20 LAB — PSA: PSA: 0.74 ng/mL (ref <=4.00)

## 2012-05-20 MED ORDER — HYDROCHLOROTHIAZIDE 12.5 MG PO CAPS: 12.5000 mg | ORAL_CAPSULE | Freq: Every day | ORAL | Status: DC

## 2012-05-20 MED ORDER — PAROXETINE HCL 20 MG PO TABS: 20.0000 mg | ORAL_TABLET | ORAL | Status: DC

## 2012-05-20 NOTE — Patient Instructions (Signed)
Call Darryl Hyers [phone]

## 2012-05-20 NOTE — Progress Notes (Signed)
Subjective:    Patient ID: Shawn Norris, male    DOB: [birth year], 57 y.o.   MRN: [id number]  HPI He is here for complete examination. He does state that over the last 6 months he has noted more difficulty with his anxiety. He is under no undue stress. His personal life is going quite well. There seems to be no identifying cause. He does state that he has had difficulty with anxiety for several decades off and on. He has also had difficulty with left inguinal discomfort and some swelling. He notes this especially with physical activity. He continues to be followed for his underlying Crohn's disease. His last colonoscopy was 2010. He does intermittently have abdominal distress. His allergies are under good control. Review of record indicates he has had some difficulty with his blood pressure. Social and family history was reviewed.   Review of Systems  Constitutional: Negative.   HENT: Negative.   Eyes: Negative.   Endocrine: Negative.   Genitourinary: Positive for testicular pain.  Musculoskeletal: Negative.   Skin: Negative.   Allergic/Immunologic: Negative.   Neurological: Negative.   Hematological: Negative.   Psychiatric/Behavioral: Negative.        Objective:   Physical Exam Pulse 100  Ht 5' 6"  (1.676 m)  Wt 144 lb (65.318 kg)  BMI 23.25 kg/m2  General Appearance:    Alert, cooperative, no distress, appears stated age  Head:    Normocephalic, without obvious abnormality, atraumatic  Eyes:    PERRL, conjunctiva/corneas clear, EOM's intact,   Ears:    Normal TM's and external ear canals  Nose:   Nares normal, mucosa normal, no drainage or sinus   tenderness  Throat:   Lips, mucosa, and tongue normal; teeth and gums normal  Neck:   Supple, no lymphadenopathy;  thyroid:  no   enlargement/tenderness/nodules; no carotid   bruit or JVD  Back:    Spine nontender, no curvature, ROM normal, no CVA     tenderness  Lungs:     Clear to auscultation bilaterally without wheezes, rales  or     ronchi; respirations unlabored  Chest Wall:    No tenderness or deformity   Heart:    Regular rate and rhythm, S1 and S2 normal, no murmur, rub   or gallop  Breast Exam:    No chest wall tenderness, masses or gynecomastia  Abdomen:     Soft, non-tender, nondistended, normoactive bowel sounds,    no masses, no hepatosplenomegaly  Genitalia:    Normal male external genitalia without lesions.  Testicles without masses.  No inguinal hernias. Tender left varicocele is noted  Rectal:    Normal sphincter tone, no masses or tenderness; guaiac negative stool.  Prostate smooth, no nodules, not enlarged.  Extremities:   No clubbing, cyanosis or edema  Pulses:   2+ and symmetric all extremities  Skin:   Skin color, texture, turgor normal, no rashes or lesions  Lymph nodes:   Cervical, supraclavicular, and axillary nodes normal  Neurologic:   CNII-XII intact, normal strength, sensation and gait; reflexes 2+ and symmetric throughout          Psych:   Normal mood, affect, hygiene and grooming.          Assessment & Plan:  Routine general medical examination at a health care facility - Plan: Lipid panel, CBC with Differential, Comprehensive metabolic panel, PSA, Hemoccult - 1 Card (office)  PSVT (paroxysmal supraventricular tachycardia)  Crohn's disease  Hyperlipidemia LDL goal < 100  Allergic  rhinitis, mild  Dysthymia - Plan: PARoxetine (PAXIL) 20 MG tablet  Left varicocele - Plan: Ambulatory referral to Urology  Hypertension - Plan: hydrochlorothiazide (MICROZIDE) 12.5 MG capsule since his anxiety symptoms are getting worse, I will increase his Paxil. Also refer for counseling to help deal with anxiety and dressed management. Refer to urology since he is symptomatic from the varicocele. I will also start him on HCTZ to help treat his hypertension.return here in one month.

## 2012-05-21 NOTE — Progress Notes (Signed)
Quick Note:  I gave him the results. He was then present medications. Schedule him with Dr. Jeffie Pollock for his symptomatic varicocele ______

## 2012-05-21 NOTE — Progress Notes (Signed)
Faxed all info yesterday

## 2012-05-29 ENCOUNTER — Telehealth: Payer: Self-pay | Admitting: Family Medicine

## 2012-05-29 NOTE — Telephone Encounter (Signed)
[facility] req refill on Simvastatin 40 mg tab

## 2012-05-30 ENCOUNTER — Other Ambulatory Visit: Payer: Self-pay

## 2012-05-30 MED ORDER — SIMVASTATIN 40 MG PO TABS: 40.0000 mg | ORAL_TABLET | Freq: Every day | ORAL | Status: DC

## 2012-05-30 NOTE — Telephone Encounter (Signed)
Sent in [facility]

## 2012-06-12 ENCOUNTER — Ambulatory Visit (INDEPENDENT_AMBULATORY_CARE_PROVIDER_SITE_OTHER): Payer: BC Managed Care – PPO | Admitting: Surgery

## 2012-06-12 ENCOUNTER — Encounter (INDEPENDENT_AMBULATORY_CARE_PROVIDER_SITE_OTHER): Payer: Self-pay | Admitting: Surgery

## 2012-06-12 VITALS — BP 150/80 | HR 79 | Temp 97.6°F | Resp 18 | Ht 66.0 in | Wt 143.0 lb

## 2012-06-12 DIAGNOSIS — K4090 Unilateral inguinal hernia, without obstruction or gangrene, not specified as recurrent: Secondary | ICD-10-CM

## 2012-06-12 NOTE — Progress Notes (Signed)
Re:   RANON COVEN  "Donnie" DOB:   1955-03-21 MRN:   [id number]  ASSESSMENT AND PLAN: 1.  Left inguinal hernia.  The hernia or the left scrotal varicocele is hurting him enough, that he wants both fixed.  I again discussed the indications and complications of hernia surgery with the patient.  I discussed both the laparoscopic and open approach to hernia repair. He is not a candidate for laparoscopic repair because of his prior abdominal surgery.  The potential risks of hernia surgery include, but are not limited to, bleeding, infection, open surgery, nerve injury, and recurrence of the hernia.  I have provided the patient literature about hernia surgery.  I will coordinate surgery with Dr. Jeffie Pollock.  He has a golf tournament on [employer] that he wants to play in.  2.  History of anxiety.  On Paxil. 3.  History of Crohn's disease.   Well controlled on Imuran. Sees Dr. Benny Lennert. 4.  HTN. 5.  Hypercholesterolemia. 6.  Left scrotal varicocele.  He has discussed surgical treatment of the varicocele with Dr. Jeffie Pollock. 7.  He has had trouble with his eyes during the last year.    His right eye has a "hole" in retina and bleed in retina.  Dr. Jeannett Senior has seen for this.  He has had bilateral cataracts by Dr. Talbert Forest.   8.  Pain posterior to left hip/buttocks muscle, I think unrelated to left inguinal hernia.  REFERRING PHYSICIAN: Wyatt Haste, MD  HISTORY OF PRESENT ILLNESS: ISAAH FURRY (goes by [employer]") is a 57 y.o. (DOB: 1955-04-26)  white male whose primary care physician is Wyatt Haste, MD and comes to me today for re-evaluate left inguinal hernia.  He is having more left groin pain.  He saw Dr. Jeffie Pollock last week for a left scrotal varicocele and discussed with him about fixing the varicocele at the same time as the hernia is fixed.  He has a golf tournament on [place] that he wants to play in - so he wants to post the case after [place] Day.  History of  inguinal hernia: I have seen the patient several times for a small left inguinal hernia. I repaired a recurrent right inguinal hernia on him in July of 1999. He has done well from this repair. He also has the scars of the lower midline abdominal incision and had an appendectomy. Since I saw him last year the hernia bothers him a little more, particularly when he plays golf. He has not noticed a change in the size of the hernia. It actually his main complaint is pain posterior on the left side at the top of his gluteus maximus. I think this is unrelated to his hernia. I have office notes about his left scrotal hydrocele from Dr. Irine Seal dated 13 March 2011.   Past Medical History  Diagnosis Date  . BPH (benign prostatic hypertrophy)   . Inguinal hernia   . Prostatitis   . Varicocele   . Anxiety   . Arthritis   . Crohn's   . Dysuria   . Hypercholesterolemia   . Hypertension       Past Surgical History  Procedure Laterality Date  . Inguinal hernia repair    . Achilles tendon repair        Current Outpatient Prescriptions  Medication Sig Dispense Refill  . azaTHIOprine (IMURAN) 50 MG tablet Take 50 mg by mouth 2 (two) times daily.      . fluticasone (FLONASE)  50 MCG/ACT nasal spray Place 2 sprays into the nose daily.  17 g  11  . hydrochlorothiazide (MICROZIDE) 12.5 MG capsule Take 1 capsule (12.5 mg total) by mouth daily.  90 capsule  3  . LORazepam (ATIVAN) 0.5 MG tablet Take 1 tablet (0.5 mg total) by mouth every 6 (six) hours as needed for anxiety.  30 tablet  0  . PARoxetine (PAXIL) 20 MG tablet Take 1 tablet (20 mg total) by mouth every morning.  30 tablet  5  . simvastatin (ZOCOR) 40 MG tablet Take 1 tablet (40 mg total) by mouth at bedtime.  90 tablet  3  . VERAPAMIL HCL PO Take 360 mg by mouth daily.       Marland Kitchen zolpidem (AMBIEN) 10 MG tablet TAKE ONE TABLET BY MOUTH AT BEDTIME AS NEEDED FOR SLEEP  30 tablet  0   No current facility-administered medications for this visit.       Allergies  Allergen Reactions  . Penicillins Rash    REVIEW OF SYSTEMS: Skin:  No history of rash.  No history of abnormal moles. Infection:  No history of hepatitis or HIV.  No history of MRSA. Neurologic:  No history of stroke.  No history of seizure.  No history of headaches. Cardiac:  Hypertension.. No history of heart disease.  No history of prior cardiac catheterization. Pulmonary:  Does not smoke cigarettes.  No asthma or bronchitis.  No OSA/CPAP.  Endocrine:  No diabetes. No thyroid disease. Gastrointestinal:  History of Crohn's disease.  Followed by Dr. Benny Lennert.  Prior history of multiple abdominal operations. Urologic:  Sees Dr. Jeffie Pollock for BPH and chronic prostatitis. Musculoskeletal:  No history of joint or back disease. Hematologic:  No bleeding disorder.  No history of anemia.  Not anticoagulated. Psycho-social:  The patient is oriented.   The patient has no obvious psychologic or social impairment to understanding our conversation and plan. Eyes:  He has had trouble with both eyes over the last year.  He has had a hole in his retina, some bleeding in his retina, and bilateral cataract surgery.  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  The tournament is in [facility] and he plays with Dr. Emilie Rutter.  PHYSICAL EXAM: BP 150/80  Pulse 79  Temp(Src) 97.6 F (36.4 C) (Temporal)  Resp 18  Ht 5' 6"  (1.676 m)  Wt 143 lb (64.864 kg)  BMI 23.09 kg/m2  General: WN thin WM who is alert and generally healthy appearing.  HEENT: Normal. Pupils equal. Good dentition. Neck: Supple. No mass.  No thyroid mass.  Lymph Nodes:  No supraclavicular or cervical nodes. Lungs: Clear to auscultation and symmetric breath sounds. Heart:  RRR. No murmur or rub.  Abdomen: He has a midline scar.  A right LQ scar (from appendectomy).  A right inguinal scar from prior right inguinal hernia.  He has a small left inguinal hernia, which I  think is unchanged over the last year.  He has a left scrotal varicocele.  I can't say that his exam has changed much. Rectal: Not done. Extremities:  Good strength and ROM  in upper and lower extremities. Neurologic:  Grossly intact to motor and sensory function. Psychiatric:  Behavior is normal.   DATA REVIEWED: Notes from Dr. Enis Slipper, MD,  [facility] [address]  [place], [place]    [place] Phone:  [place]

## 2012-06-22 ENCOUNTER — Other Ambulatory Visit: Payer: Self-pay | Admitting: Family Medicine

## 2012-06-25 ENCOUNTER — Other Ambulatory Visit: Payer: Self-pay | Admitting: Urology

## 2012-06-27 ENCOUNTER — Ambulatory Visit (INDEPENDENT_AMBULATORY_CARE_PROVIDER_SITE_OTHER): Payer: BC Managed Care – PPO | Admitting: Surgery

## 2012-07-11 ENCOUNTER — Other Ambulatory Visit: Payer: Self-pay | Admitting: Family Medicine

## 2012-07-11 NOTE — Telephone Encounter (Signed)
Okay for a refill

## 2012-07-11 NOTE — Telephone Encounter (Signed)
IS THIS OK

## 2012-08-15 ENCOUNTER — Ambulatory Visit: Admit: 2012-08-15 | Payer: Self-pay | Admitting: Surgery

## 2012-08-15 SURGERY — REPAIR, HERNIA, INGUINAL, ADULT
Anesthesia: General | Laterality: Left

## 2012-08-27 ENCOUNTER — Telehealth: Payer: Self-pay | Admitting: Internal Medicine

## 2012-08-27 MED ORDER — ZOLPIDEM TARTRATE 10 MG PO TABS: ORAL_TABLET | ORAL | Status: DC

## 2012-08-27 NOTE — Telephone Encounter (Signed)
[facility] [address]

## 2012-08-27 NOTE — Telephone Encounter (Signed)
CALLED AMBIEN IN

## 2012-08-27 NOTE — Telephone Encounter (Signed)
Okay to renew

## 2012-09-05 ENCOUNTER — Ambulatory Visit (HOSPITAL_COMMUNITY): Admission: RE | Admit: 2012-09-05 | Payer: BC Managed Care – PPO | Source: Ambulatory Visit | Admitting: Surgery

## 2012-09-05 ENCOUNTER — Encounter (HOSPITAL_COMMUNITY): Admission: RE | Payer: Self-pay | Source: Ambulatory Visit

## 2012-09-05 SURGERY — REPAIR, HERNIA, INGUINAL, ADULT
Anesthesia: General | Laterality: Left

## 2012-09-22 ENCOUNTER — Other Ambulatory Visit: Payer: Self-pay | Admitting: Family Medicine

## 2012-09-30 ENCOUNTER — Other Ambulatory Visit: Payer: Self-pay | Admitting: Family Medicine

## 2012-11-25 ENCOUNTER — Encounter: Payer: Self-pay | Admitting: Cardiology

## 2012-11-25 ENCOUNTER — Other Ambulatory Visit (INDEPENDENT_AMBULATORY_CARE_PROVIDER_SITE_OTHER): Payer: BC Managed Care – PPO

## 2012-11-25 DIAGNOSIS — Z23 Encounter for immunization: Secondary | ICD-10-CM

## 2012-11-25 DIAGNOSIS — Z8679 Personal history of other diseases of the circulatory system: Secondary | ICD-10-CM | POA: Insufficient documentation

## 2012-11-26 ENCOUNTER — Ambulatory Visit (INDEPENDENT_AMBULATORY_CARE_PROVIDER_SITE_OTHER): Payer: BC Managed Care – PPO | Admitting: Cardiology

## 2012-11-26 ENCOUNTER — Encounter: Payer: Self-pay | Admitting: Cardiology

## 2012-11-26 VITALS — BP 130/50 | Ht 66.0 in | Wt 147.2 lb

## 2012-11-26 DIAGNOSIS — E785 Hyperlipidemia, unspecified: Secondary | ICD-10-CM

## 2012-11-26 DIAGNOSIS — Z8679 Personal history of other diseases of the circulatory system: Secondary | ICD-10-CM

## 2012-11-26 DIAGNOSIS — I10 Essential (primary) hypertension: Secondary | ICD-10-CM

## 2012-11-26 NOTE — Patient Instructions (Signed)
You are doing great!!.  Blood pressure is much better.  ECG is almost identical.  Lets keep things as they are & see you back in 12 months.  Leonie Man, MD  Your physician wants you to follow-up in: 12 months You will receive a reminder letter in the mail two months in advance. If you don't receive a letter, please call our office to schedule the follow-up appointment.

## 2012-11-29 NOTE — Progress Notes (Signed)
PATIENT: Shawn Norris MRN: [id number]  DOB: 1955-11-14   DOV:11/29/2012 PCP: Wyatt Haste, MD  Clinic Note: Chief Complaint  Patient presents with  . Annual Exam    no chest pain,no sob , no edema    HPI: Shawn Norris is a 57 y.o. male with a PMH below who presents today for annual followup of his cardiac risk factors.  History of SVT in the past.  Interval History: He presents today doing relatively well.  He's been exercising routinely without any symptoms.  He has occasional quick skipping beats but nothing prolonged nothing more than maybe a few seconds.  Otherwise he is relatively asymptomatic.  He denies any other significant cardiac symptoms.  The remainder of Cardiovascular ROS: no chest pain or dyspnea on exertion negative for - edema, irregular heartbeat, loss of consciousness, murmur, orthopnea, palpitations, paroxysmal nocturnal dyspnea, rapid heart rate or shortness of breath: Additional cardiac review of systems: Lightheadedness - no, dizziness - no, syncope/near-syncope - no; TIA/amaurosis fugax - no Melena - no, hematochezia no; hematuria - no; nosebleeds - no; claudication - no  Past Medical History  Diagnosis Date  . BPH (benign prostatic hypertrophy)   . Inguinal hernia   . Prostatitis   . Varicocele   . Anxiety   . Arthritis   . Crohn's   . Dysuria   . Hypercholesterolemia     May 2013 - TC 152, TG 48, HDL 60, LDL 82  . Hypertension   . History of PSVT (paroxysmal supraventricular tachycardia)    Prior Cardiac Evaluation and Past Surgical History: Past Surgical History  Procedure Laterality Date  . Inguinal hernia repair    . Achilles tendon repair     Allergies  Allergen Reactions  . Penicillins Rash    Current Outpatient Prescriptions  Medication Sig Dispense Refill  . aspirin EC 81 MG tablet Take 81 mg by mouth daily.      Marland Kitchen azaTHIOprine (IMURAN) 50 MG tablet TAKE 1 TABLET BY MOUTH TWICE DAILY  60 tablet  11  . fluticasone  (FLONASE) 50 MCG/ACT nasal spray INSTILL 2 SPRAYS INTO EACH NOSTRIL DAILY  17 g  5  . hydrochlorothiazide (MICROZIDE) 12.5 MG capsule Take 1 capsule (12.5 mg total) by mouth daily.  90 capsule  3  . LORazepam (ATIVAN) 0.5 MG tablet Take 1 tablet (0.5 mg total) by mouth every 6 (six) hours as needed for anxiety.  30 tablet  0  . PARoxetine (PAXIL) 20 MG tablet Take 1 tablet (20 mg total) by mouth every morning.  30 tablet  5  . simvastatin (ZOCOR) 40 MG tablet Take 1 tablet (40 mg total) by mouth at bedtime.  90 tablet  3  . verapamil (VERELAN PM) 360 MG 24 hr capsule TAKE ONE CAPSULE BY MOUTH DAILY  90 capsule  3  . zolpidem (AMBIEN) 10 MG tablet TAKE 1 TABLET BY MOUTH EVERY NIGHT AT BEDTIME AS NEEDED FOR SLEEP  30 tablet  0   No current facility-administered medications for this visit.    History   Social History Narrative    He is a single healthy gentleman. He is an avid exerciser at the gym with treadmill, and other exercises routinely. He drinks social alcohol but otherwise is stable.     ROS: A comprehensive Review of Systems - Negative except Mild occasional aches and pains.  PHYSICAL EXAM BP 130/50  Ht 5' 6"  (1.676 m)  Wt 147 lb 3.2 oz (66.769 kg)  BMI 23.77 kg/m2  General appearance: alert, cooperative, appears stated age, no distress and not obese Neck: no adenopathy, no carotid bruit and no JVD Lungs: clear to auscultation bilaterally, normal percussion bilaterally and non-labored Heart: regular rate and rhythm, S1, S2 normal, no murmur, click, rub or gallop; nondisplaced in the Abdomen: soft, non-tender; bowel sounds normal; no masses, no HSM Extremities: extremities normal, atraumatic, no cyanosis, ed or ema Pulses: 2+ and symmetric Neurologic: Mental status: Alert, oriented, thought content appropriate  [id number] today: Yes Rate: 69 , Rhythm:  NSR, borderline end of the RBBB;   no significant change  Recent Labs:  last set reviewed from March.  Well-controlled at  his risk   ASSESSMENT / PLAN: History of PSVT (paroxysmal supraventricular tachycardia) No real recurrent symptoms.  He is pretty-well-controlled with verapamil.  No changes required  Hyperlipidemia LDL goal < 100 Clearly at his goal for his risk factors.  He is on simvastatin and tolerating it relatively well.  Hypertension Blood pressure is well-controlled on Calan/verapamil plus HCTZ.  Stable, no change required.   Orders Placed This Encounter  Procedures  . EKG 12-Lead   Meds ordered this encounter  Medications  . aspirin EC 81 MG tablet    Sig: Take 81 mg by mouth daily.    1 year Followup  DAVID W. Ellyn Hack, M.D., M.S.  [facility]  [address] [place], [place]  [address]  [phone] Pager # [phone]

## 2012-11-29 NOTE — Assessment & Plan Note (Signed)
No real recurrent symptoms.  He is pretty-well-controlled with verapamil.  No changes required

## 2012-11-29 NOTE — Assessment & Plan Note (Signed)
Blood pressure is well-controlled on Calan/verapamil plus HCTZ.  Stable, no change required.

## 2012-11-29 NOTE — Assessment & Plan Note (Signed)
Clearly at his goal for his risk factors.  He is on simvastatin and tolerating it relatively well.

## 2013-01-08 ENCOUNTER — Ambulatory Visit (INDEPENDENT_AMBULATORY_CARE_PROVIDER_SITE_OTHER): Payer: BC Managed Care – PPO | Admitting: Family Medicine

## 2013-01-08 ENCOUNTER — Encounter: Payer: Self-pay | Admitting: Family Medicine

## 2013-01-08 VITALS — BP 144/80 | HR 90 | Wt 146.0 lb

## 2013-01-08 DIAGNOSIS — I471 Supraventricular tachycardia, unspecified: Secondary | ICD-10-CM

## 2013-01-08 DIAGNOSIS — I10 Essential (primary) hypertension: Secondary | ICD-10-CM

## 2013-01-08 DIAGNOSIS — L570 Actinic keratosis: Secondary | ICD-10-CM

## 2013-01-08 MED ORDER — LISINOPRIL-HYDROCHLOROTHIAZIDE 10-12.5 MG PO TABS: 1.0000 | ORAL_TABLET | Freq: Every day | ORAL | Status: DC

## 2013-01-08 NOTE — Progress Notes (Signed)
  Subjective:    Patient ID: Shawn Norris, male    DOB: [birth year], 57 y.o.   MRN: [id number]  HPI  He is here for blood pressure recheck. He has been checking his blood pressure fairly regularly over the last month and it's been averaging close to 150/90. Continues on verapamil and HCTZ. He has a history of PSVT. He also has lesions present on his scalp that he would like evaluated.  Review of Systems     Objective:   Physical Exam Alert and in no distress. Blood pressure is recorded. Exam of his scalp does show several slightly dry and pinkish lesions.       Assessment & Plan:  PSVT (paroxysmal supraventricular tachycardia)  Hypertension - Plan: lisinopril-hydrochlorothiazide (PRINZIDE,ZESTORETIC) 10-12.5 MG per tablet  Actinic keratosis of scalp  I will add lisinopril to his regimen in the form of Prinzide. Discussed side effects of cough and possible edema. He will let me know if that occurs. Discussed treatment of his actinic keratoses and he will discuss this further with his dermatologist. Check here in one month.

## 2013-01-08 NOTE — Patient Instructions (Signed)
If you notice a slight cough or any swelling on the new medication let me know

## 2013-02-07 ENCOUNTER — Encounter: Payer: Self-pay | Admitting: Family Medicine

## 2013-02-07 ENCOUNTER — Ambulatory Visit (INDEPENDENT_AMBULATORY_CARE_PROVIDER_SITE_OTHER): Payer: BC Managed Care – PPO | Admitting: Family Medicine

## 2013-02-07 VITALS — BP 112/70 | HR 80 | Wt 147.0 lb

## 2013-02-07 DIAGNOSIS — I10 Essential (primary) hypertension: Secondary | ICD-10-CM

## 2013-02-07 MED ORDER — LISINOPRIL-HYDROCHLOROTHIAZIDE 10-12.5 MG PO TABS: 1.0000 | ORAL_TABLET | Freq: Every day | ORAL | Status: DC

## 2013-02-07 NOTE — Progress Notes (Signed)
   Subjective:    Patient ID: Shawn Norris, male    DOB: Feb 18, 1956, 57 y.o.   MRN: [id number]  HPI He is here for recheck. He is now taking lisinopril. He has had no cough, congestion, swelling.   Review of Systems     Objective:   Physical Exam Alert and in no distress. Blood pressure is recorded.       Assessment & Plan:  Hypertension - Plan: lisinopril-hydrochlorothiazide (PRINZIDE,ZESTORETIC) 10-12.5 MG per tablet  continue on present medication regimen.

## 2013-02-09 ENCOUNTER — Other Ambulatory Visit: Payer: Self-pay | Admitting: Family Medicine

## 2013-02-10 ENCOUNTER — Other Ambulatory Visit: Payer: Self-pay

## 2013-02-10 DIAGNOSIS — I10 Essential (primary) hypertension: Secondary | ICD-10-CM

## 2013-02-10 MED ORDER — SIMVASTATIN 40 MG PO TABS: 40.0000 mg | ORAL_TABLET | Freq: Every day | ORAL | Status: DC

## 2013-02-10 MED ORDER — FLUTICASONE PROPIONATE 50 MCG/ACT NA SUSP: NASAL | Status: DC

## 2013-02-10 MED ORDER — ZOLPIDEM TARTRATE 10 MG PO TABS: ORAL_TABLET | ORAL | Status: DC

## 2013-02-10 MED ORDER — LISINOPRIL-HYDROCHLOROTHIAZIDE 10-12.5 MG PO TABS: 1.0000 | ORAL_TABLET | Freq: Every day | ORAL | Status: DC

## 2013-02-10 MED ORDER — VERAPAMIL HCL ER 360 MG PO CP24: ORAL_CAPSULE | ORAL | Status: DC

## 2013-02-10 NOTE — Telephone Encounter (Signed)
Sent in meds 

## 2013-04-27 ENCOUNTER — Other Ambulatory Visit: Payer: Self-pay | Admitting: Family Medicine

## 2013-04-29 ENCOUNTER — Other Ambulatory Visit: Payer: Self-pay | Admitting: Family Medicine

## 2013-04-29 NOTE — Telephone Encounter (Signed)
Forwarding to you :)

## 2013-04-29 NOTE — Telephone Encounter (Signed)
Medication called in 

## 2013-04-29 NOTE — Telephone Encounter (Signed)
Is this ok to refill?  

## 2013-06-16 ENCOUNTER — Other Ambulatory Visit: Payer: BC Managed Care – PPO

## 2013-06-16 ENCOUNTER — Telehealth: Payer: Self-pay

## 2013-06-16 DIAGNOSIS — E785 Hyperlipidemia, unspecified: Secondary | ICD-10-CM

## 2013-06-16 DIAGNOSIS — Z0000 Encounter for general adult medical examination without abnormal findings: Secondary | ICD-10-CM

## 2013-06-16 LAB — CBC WITH DIFFERENTIAL/PLATELET
Basophils Absolute: 0 10*3/uL (ref 0.0–0.1)
Basophils Relative: 1 % (ref 0–1)
Eosinophils Absolute: 0.1 10*3/uL (ref 0.0–0.7)
Eosinophils Relative: 2 % (ref 0–5)
HCT: 42.3 % (ref 39.0–52.0)
Hemoglobin: 14.6 g/dL (ref 13.0–17.0)
Lymphocytes Relative: 30 % (ref 12–46)
Lymphs Abs: 1.2 10*3/uL (ref 0.7–4.0)
MCH: 30.2 pg (ref 26.0–34.0)
MCHC: 34.5 g/dL (ref 30.0–36.0)
MCV: 87.6 fL (ref 78.0–100.0)
Monocytes Absolute: 0.4 10*3/uL (ref 0.1–1.0)
Monocytes Relative: 9 % (ref 3–12)
Neutro Abs: 2.3 10*3/uL (ref 1.7–7.7)
Neutrophils Relative %: 58 % (ref 43–77)
Platelets: 234 10*3/uL (ref 150–400)
RBC: 4.83 MIL/uL (ref 4.22–5.81)
RDW: 13.6 % (ref 11.5–15.5)
WBC: 3.9 10*3/uL — ABNORMAL LOW (ref 4.0–10.5)

## 2013-06-16 LAB — COMPREHENSIVE METABOLIC PANEL
ALT: 21 U/L (ref 0–53)
AST: 30 U/L (ref 0–37)
Albumin: 4.4 g/dL (ref 3.5–5.2)
Alkaline Phosphatase: 23 U/L — ABNORMAL LOW (ref 39–117)
BUN: 18 mg/dL (ref 6–23)
CO2: 28 mEq/L (ref 19–32)
Calcium: 9.6 mg/dL (ref 8.4–10.5)
Chloride: 103 mEq/L (ref 96–112)
Creat: 1.04 mg/dL (ref 0.50–1.35)
Glucose, Bld: 102 mg/dL — ABNORMAL HIGH (ref 70–99)
Potassium: 4.2 mEq/L (ref 3.5–5.3)
Sodium: 140 mEq/L (ref 135–145)
Total Bilirubin: 0.8 mg/dL (ref 0.2–1.2)
Total Protein: 6.8 g/dL (ref 6.0–8.3)

## 2013-06-16 LAB — LIPID PANEL
Cholesterol: 164 mg/dL (ref 0–200)
HDL: 57 mg/dL (ref >39)
LDL Cholesterol: 93 mg/dL (ref 0–99)
Total CHOL/HDL Ratio: 2.9 Ratio
Triglycerides: 71 mg/dL (ref <150)
VLDL: 14 mg/dL (ref 0–40)

## 2013-06-17 ENCOUNTER — Ambulatory Visit (INDEPENDENT_AMBULATORY_CARE_PROVIDER_SITE_OTHER): Payer: BC Managed Care – PPO | Admitting: Family Medicine

## 2013-06-17 ENCOUNTER — Encounter: Payer: Self-pay | Admitting: Family Medicine

## 2013-06-17 VITALS — BP 108/60 | HR 64 | Ht 65.5 in | Wt 147.0 lb

## 2013-06-17 DIAGNOSIS — R1032 Left lower quadrant pain: Secondary | ICD-10-CM

## 2013-06-17 DIAGNOSIS — K5090 Crohn's disease, unspecified, without complications: Secondary | ICD-10-CM

## 2013-06-17 DIAGNOSIS — I10 Essential (primary) hypertension: Secondary | ICD-10-CM

## 2013-06-17 DIAGNOSIS — E7800 Pure hypercholesterolemia, unspecified: Secondary | ICD-10-CM

## 2013-06-17 DIAGNOSIS — Z8679 Personal history of other diseases of the circulatory system: Secondary | ICD-10-CM

## 2013-06-17 DIAGNOSIS — Z0000 Encounter for general adult medical examination without abnormal findings: Secondary | ICD-10-CM

## 2013-06-17 DIAGNOSIS — F341 Dysthymic disorder: Secondary | ICD-10-CM

## 2013-06-17 DIAGNOSIS — R109 Unspecified abdominal pain: Secondary | ICD-10-CM

## 2013-06-17 DIAGNOSIS — J309 Allergic rhinitis, unspecified: Secondary | ICD-10-CM | POA: Insufficient documentation

## 2013-06-17 LAB — POCT URINALYSIS DIPSTICK
Bilirubin, UA: NEGATIVE
Blood, UA: NEGATIVE
Glucose, UA: NEGATIVE
Ketones, UA: NEGATIVE
Leukocytes, UA: NEGATIVE
Nitrite, UA: NEGATIVE
Protein, UA: NEGATIVE
Spec Grav, UA: 1.02
Urobilinogen, UA: NEGATIVE
pH, UA: 5

## 2013-06-17 LAB — PSA: PSA: 0.73 ng/mL (ref <=4.00)

## 2013-06-17 MED ORDER — PAROXETINE HCL 20 MG PO TABS: ORAL_TABLET | ORAL | Status: DC

## 2013-06-17 MED ORDER — SIMVASTATIN 40 MG PO TABS: 40.0000 mg | ORAL_TABLET | Freq: Every day | ORAL | Status: DC

## 2013-06-17 MED ORDER — AZATHIOPRINE 50 MG PO TABS: ORAL_TABLET | ORAL | Status: DC

## 2013-06-17 MED ORDER — LISINOPRIL-HYDROCHLOROTHIAZIDE 10-12.5 MG PO TABS: 1.0000 | ORAL_TABLET | Freq: Every day | ORAL | Status: DC

## 2013-06-17 MED ORDER — FLUTICASONE PROPIONATE 50 MCG/ACT NA SUSP: NASAL | Status: DC

## 2013-06-17 MED ORDER — VERAPAMIL HCL ER 360 MG PO CP24: ORAL_CAPSULE | ORAL | Status: DC

## 2013-06-17 NOTE — Telephone Encounter (Signed)
error 

## 2013-06-17 NOTE — Progress Notes (Signed)
Subjective:    Patient ID: Shawn Norris, male    DOB: Feb 23, 1955, 58 y.o.   MRN: [id number]  HPI He is here for complete examination. He does have an underlying history of Crohn's disease and is doing quite well on his present medication regimen. He has not seen his gastroenterologist in a year or 2. His last colonoscopy was 2010. He also has a history of PSVT but has not had any arrhythmias reported. He continues on Zocor and Prinzide. He does have underlying allergies and does use Flonase. He continues on Paxil for treatment of his underlying dysthymia. He does occasionally have panic attacks and uses Ativan very irregularly. He has had difficulty with over the last several months with left inguinal pain. No sore family history were reviewed.   Review of Systems  All other systems reviewed and are negative.      Objective:   Physical Exam BP 108/60  Pulse 64  Ht 5' 5.5" (1.664 m)  Wt 147 lb (66.679 kg)  BMI 24.08 kg/m2  General Appearance:    Alert, cooperative, no distress, appears stated age  Head:    Normocephalic, without obvious abnormality, atraumatic  Eyes:    PERRL, conjunctiva/corneas clear, EOM's intact, fundi    benign  Ears:    Normal TM's and external ear canals  Nose:   Nares normal, mucosa normal, no drainage or sinus   tenderness  Throat:   Lips, mucosa, and tongue normal; teeth and gums normal  Neck:   Supple, no lymphadenopathy;  thyroid:  no   enlargement/tenderness/nodules; no carotid   bruit or JVD  Back:    Spine nontender, no curvature, ROM normal, no CVA     tenderness  Lungs:     Clear to auscultation bilaterally without wheezes, rales or     ronchi; respirations unlabored  Chest Wall:    No tenderness or deformity   Heart:    Regular rate and rhythm, S1 and S2 normal, no murmur, rub   or gallop  Breast Exam:    No chest wall tenderness, masses or gynecomastia  Abdomen:     Soft, non-tender, nondistended, normoactive bowel sounds,    no masses, no  hepatosplenomegaly  Genitalia:    Normal male external genitalia without lesions.  Testicles without masses.  No inguinal hernias.  Rectal:    deferred  Extremities:   No clubbing, cyanosis or edema  Pulses:   2+ and symmetric all extremities  Skin:   Skin color, texture, turgor normal, no rashes or lesions  Lymph nodes:   Cervical, supraclavicular, and axillary nodes normal  Neurologic:   CNII-XII intact, normal strength, sensation and gait; reflexes 2+ and symmetric throughout          Psych:   Normal mood, affect, hygiene and grooming.          Assessment & Plan:  Routine general medical examination at a health care facility - Plan: POCT urinalysis dipstick  Crohn's disease - Plan: azaTHIOprine (IMURAN) 50 MG tablet  History of PSVT (paroxysmal supraventricular tachycardia) - Plan: verapamil (VERELAN PM) 360 MG 24 hr capsule  Hypercholesterolemia - Plan: simvastatin (ZOCOR) 40 MG tablet  Hypertension - Plan: lisinopril-hydrochlorothiazide (PRINZIDE,ZESTORETIC) 10-12.5 MG per tablet  Dysthymia - Plan: PARoxetine (PAXIL) 20 MG tablet  Allergic rhinitis - Plan: fluticasone (FLONASE) 50 MCG/ACT nasal spray  Left inguinal pain  reassured him that I found nothing of note in the inguinal area. He will continue to use compression garments. Continue on  all of his other medications. Did warn him about using alcohol for relaxation. Also encouraged him to followup periodically with his gastroenterologist. He should be set up for colonoscopy later this year.

## 2013-07-07 ENCOUNTER — Other Ambulatory Visit: Payer: Self-pay | Admitting: Family Medicine

## 2013-07-11 ENCOUNTER — Other Ambulatory Visit: Payer: Self-pay | Admitting: Family Medicine

## 2013-10-21 ENCOUNTER — Other Ambulatory Visit: Payer: Self-pay | Admitting: Family Medicine

## 2013-10-21 NOTE — Telephone Encounter (Signed)
Called out med to pharmacy 

## 2013-10-21 NOTE — Telephone Encounter (Signed)
Is this okay to refill? 

## 2013-10-21 NOTE — Telephone Encounter (Signed)
Okay to renew

## 2013-11-21 ENCOUNTER — Other Ambulatory Visit (INDEPENDENT_AMBULATORY_CARE_PROVIDER_SITE_OTHER): Payer: BC Managed Care – PPO

## 2013-11-21 DIAGNOSIS — Z23 Encounter for immunization: Secondary | ICD-10-CM

## 2013-12-05 ENCOUNTER — Other Ambulatory Visit: Payer: Self-pay | Admitting: Family Medicine

## 2014-01-27 ENCOUNTER — Other Ambulatory Visit: Payer: Self-pay | Admitting: Family Medicine

## 2014-01-27 NOTE — Telephone Encounter (Signed)
Okay to renew

## 2014-01-27 NOTE — Telephone Encounter (Signed)
Is this okay?

## 2014-02-10 ENCOUNTER — Other Ambulatory Visit: Payer: Self-pay | Admitting: Family Medicine

## 2014-02-14 ENCOUNTER — Other Ambulatory Visit: Payer: Self-pay | Admitting: Family Medicine

## 2014-02-14 MED ORDER — AZITHROMYCIN 500 MG PO TABS: 500.0000 mg | ORAL_TABLET | Freq: Every day | ORAL | Status: DC

## 2014-02-19 ENCOUNTER — Other Ambulatory Visit: Payer: Self-pay | Admitting: Family Medicine

## 2014-02-19 MED ORDER — SULFAMETHOXAZOLE-TRIMETHOPRIM 800-160 MG PO TABS: 1.0000 | ORAL_TABLET | Freq: Two times a day (BID) | ORAL | Status: DC

## 2014-03-24 ENCOUNTER — Other Ambulatory Visit: Payer: Self-pay | Admitting: Family Medicine

## 2014-03-24 ENCOUNTER — Other Ambulatory Visit: Payer: Self-pay

## 2014-03-24 NOTE — Telephone Encounter (Signed)
Is this okay?

## 2014-03-24 NOTE — Telephone Encounter (Signed)
Called in [place]

## 2014-04-21 ENCOUNTER — Other Ambulatory Visit: Payer: Self-pay | Admitting: Family Medicine

## 2014-05-21 ENCOUNTER — Other Ambulatory Visit: Payer: Self-pay

## 2014-05-21 ENCOUNTER — Other Ambulatory Visit: Payer: Self-pay | Admitting: Family Medicine

## 2014-05-21 NOTE — Telephone Encounter (Signed)
Okay to renew

## 2014-05-21 NOTE — Telephone Encounter (Signed)
Is this okay?

## 2014-05-27 ENCOUNTER — Other Ambulatory Visit: Payer: Self-pay | Admitting: Family Medicine

## 2014-05-27 ENCOUNTER — Other Ambulatory Visit: Payer: Self-pay

## 2014-05-27 MED ORDER — LORAZEPAM 0.5 MG PO TABS: 0.5000 mg | ORAL_TABLET | Freq: Four times a day (QID) | ORAL | Status: DC | PRN

## 2014-06-11 ENCOUNTER — Other Ambulatory Visit: Payer: Self-pay | Admitting: Family Medicine

## 2014-06-12 ENCOUNTER — Other Ambulatory Visit: Payer: No Typology Code available for payment source

## 2014-06-12 DIAGNOSIS — Z8679 Personal history of other diseases of the circulatory system: Secondary | ICD-10-CM

## 2014-06-12 DIAGNOSIS — E785 Hyperlipidemia, unspecified: Secondary | ICD-10-CM

## 2014-06-12 DIAGNOSIS — I159 Secondary hypertension, unspecified: Secondary | ICD-10-CM

## 2014-06-12 LAB — COMPREHENSIVE METABOLIC PANEL
ALT: 12 U/L (ref 0–53)
AST: 16 U/L (ref 0–37)
Albumin: 4 g/dL (ref 3.5–5.2)
Alkaline Phosphatase: 26 U/L — ABNORMAL LOW (ref 39–117)
BUN: 15 mg/dL (ref 6–23)
CO2: 26 mEq/L (ref 19–32)
Calcium: 9 mg/dL (ref 8.4–10.5)
Chloride: 103 mEq/L (ref 96–112)
Creat: 0.99 mg/dL (ref 0.50–1.35)
Glucose, Bld: 93 mg/dL (ref 70–99)
Potassium: 3.8 mEq/L (ref 3.5–5.3)
Sodium: 141 mEq/L (ref 135–145)
Total Bilirubin: 0.5 mg/dL (ref 0.2–1.2)
Total Protein: 6.6 g/dL (ref 6.0–8.3)

## 2014-06-12 LAB — LIPID PANEL
Cholesterol: 163 mg/dL (ref 0–200)
HDL: 58 mg/dL (ref >=40)
LDL Cholesterol: 91 mg/dL (ref 0–99)
Total CHOL/HDL Ratio: 2.8 Ratio
Triglycerides: 71 mg/dL (ref <150)
VLDL: 14 mg/dL (ref 0–40)

## 2014-06-12 LAB — CBC WITH DIFFERENTIAL/PLATELET
Basophils Absolute: 0 10*3/uL (ref 0.0–0.1)
Basophils Relative: 1 % (ref 0–1)
Eosinophils Absolute: 0.1 10*3/uL (ref 0.0–0.7)
Eosinophils Relative: 3 % (ref 0–5)
HCT: 40 % (ref 39.0–52.0)
Hemoglobin: 14.2 g/dL (ref 13.0–17.0)
Lymphocytes Relative: 25 % (ref 12–46)
Lymphs Abs: 1.1 10*3/uL (ref 0.7–4.0)
MCH: 30.7 pg (ref 26.0–34.0)
MCHC: 35.5 g/dL (ref 30.0–36.0)
MCV: 86.6 fL (ref 78.0–100.0)
MPV: 11 fL (ref 8.6–12.4)
Monocytes Absolute: 0.4 10*3/uL (ref 0.1–1.0)
Monocytes Relative: 9 % (ref 3–12)
Neutro Abs: 2.6 10*3/uL (ref 1.7–7.7)
Neutrophils Relative %: 62 % (ref 43–77)
Platelets: 218 10*3/uL (ref 150–400)
RBC: 4.62 MIL/uL (ref 4.22–5.81)
RDW: 14 % (ref 11.5–15.5)
WBC: 4.2 10*3/uL (ref 4.0–10.5)

## 2014-06-24 ENCOUNTER — Other Ambulatory Visit: Payer: Self-pay | Admitting: Family Medicine

## 2014-06-24 NOTE — Telephone Encounter (Signed)
Is this okay to refill? Patient is coming in on 06/25/14 for a CPE Last CPE 05/2013

## 2014-06-25 ENCOUNTER — Ambulatory Visit (INDEPENDENT_AMBULATORY_CARE_PROVIDER_SITE_OTHER): Payer: No Typology Code available for payment source | Admitting: Family Medicine

## 2014-06-25 ENCOUNTER — Encounter: Payer: Self-pay | Admitting: Family Medicine

## 2014-06-25 VITALS — BP 100/58 | HR 70 | Wt 144.4 lb

## 2014-06-25 DIAGNOSIS — I10 Essential (primary) hypertension: Secondary | ICD-10-CM | POA: Diagnosis not present

## 2014-06-25 DIAGNOSIS — K50919 Crohn's disease, unspecified, with unspecified complications: Secondary | ICD-10-CM

## 2014-06-25 DIAGNOSIS — F341 Dysthymic disorder: Secondary | ICD-10-CM

## 2014-06-25 DIAGNOSIS — Z0000 Encounter for general adult medical examination without abnormal findings: Secondary | ICD-10-CM | POA: Diagnosis not present

## 2014-06-25 DIAGNOSIS — E7800 Pure hypercholesterolemia, unspecified: Secondary | ICD-10-CM

## 2014-06-25 DIAGNOSIS — J301 Allergic rhinitis due to pollen: Secondary | ICD-10-CM | POA: Diagnosis not present

## 2014-06-25 DIAGNOSIS — Z8679 Personal history of other diseases of the circulatory system: Secondary | ICD-10-CM | POA: Diagnosis not present

## 2014-06-25 NOTE — Patient Instructions (Signed)
Call Shawn Norris  [phone]

## 2014-06-25 NOTE — Progress Notes (Signed)
   Subjective:    Patient ID: Shawn Norris, male    DOB: 04/20/1955, 59 y.o.   MRN: [id number]  HPI He is here for complete examination. He does have underlying Crohn's disease and will get follow-up with his gastroenterologist in the near future. He knows he needs another colonoscopy. He does intermittently have abdominal distress especially with certain foods that nose which ones to avoid. He also has a history of PSVT however set having no difficulty with chest pain or irregular heart rate. He continues on the medications listed in the chart. His allergies are under good controlHe has been having great deal of difficulty With anxiety. He continues on Paxil and is now taking 20 mg. He does use Ativan very sparingly. The anxiety seems to be triggered by any disruption of the status quo. He is not having any other major life stressors. His work and home life are going well. Family and social history as well as immunizations were reviewed. Socially things are going quite well.  Review of Systems  All other systems reviewed and are negative.      Objective:   Physical Exam BP 100/58 mmHg  Pulse 70  Wt 144 lb 6.4 oz (65.499 kg)  General Appearance:    Alert, cooperative, no distress, appears stated age  Head:    Normocephalic, without obvious abnormality, atraumatic  Eyes:    PERRL, conjunctiva/corneas clear, EOM's intact, fundi    benign  Ears:    Normal TM's and external ear canals  Nose:   Nares normal, mucosa normal, no drainage or sinus   tenderness  Throat:   Lips, mucosa, and tongue normal; teeth and gums normal  Neck:   Supple, no lymphadenopathy;  thyroid:  no   enlargement/tenderness/nodules; no carotid   bruit or JVD  Back:    Spine nontender, no curvature, ROM normal, no CVA     tenderness  Lungs:     Clear to auscultation bilaterally without wheezes, rales or     ronchi; respirations unlabored  Chest Wall:    No tenderness or deformity   Heart:    Regular rate and rhythm,  S1 and S2 normal, no murmur, rub   or gallop  Breast Exam:    No chest wall tenderness, masses or gynecomastia  Abdomen:     Soft, non-tender, nondistended, normoactive bowel sounds,    no masses, no hepatosplenomegaly        Extremities:   No clubbing, cyanosis or edema  Pulses:   2+ and symmetric all extremities  Skin:   Skin color, texture, turgor normal, no rashes or lesions  Lymph nodes:   Cervical, supraclavicular, and axillary nodes normal  Neurologic:   CNII-XII intact, normal strength, sensation and gait; reflexes 2+ and symmetric throughout          Psych:   Normal mood, affect, hygiene and grooming.         Assessment & Plan:  Routine general medical examination at a health care facility  Crohn's disease, unspecified complication  History of PSVT (paroxysmal supraventricular tachycardia)  Hypercholesterolemia  Essential hypertension  Dysthymia  Allergic rhinitis due to pollen We'll continue on his present medication regimen. Discussed counseling with him to help identify what causing his anxiety/panic a situation. He will get involved in counseling. I will continue to keep him on the same regimen but did discuss the possibility of switching to a different antidepressant medication.

## 2014-07-01 ENCOUNTER — Encounter: Payer: Self-pay | Admitting: *Deleted

## 2014-07-04 ENCOUNTER — Other Ambulatory Visit: Payer: Self-pay | Admitting: Family Medicine

## 2014-07-10 ENCOUNTER — Other Ambulatory Visit: Payer: Self-pay | Admitting: Family Medicine

## 2014-07-10 NOTE — Telephone Encounter (Signed)
Okay to renew

## 2014-07-10 NOTE — Telephone Encounter (Signed)
Called in med to pharmacy  

## 2014-07-10 NOTE — Telephone Encounter (Signed)
Is this okay to refill? 

## 2014-07-28 ENCOUNTER — Encounter: Payer: Self-pay | Admitting: Family Medicine

## 2014-07-30 LAB — HM COLONOSCOPY

## 2014-08-03 ENCOUNTER — Ambulatory Visit (INDEPENDENT_AMBULATORY_CARE_PROVIDER_SITE_OTHER): Payer: No Typology Code available for payment source | Admitting: Family Medicine

## 2014-08-03 ENCOUNTER — Encounter: Payer: Self-pay | Admitting: Internal Medicine

## 2014-08-03 ENCOUNTER — Encounter: Payer: Self-pay | Admitting: Family Medicine

## 2014-08-03 VITALS — BP 114/70 | HR 80 | Wt 144.0 lb

## 2014-08-03 DIAGNOSIS — L989 Disorder of the skin and subcutaneous tissue, unspecified: Secondary | ICD-10-CM | POA: Diagnosis not present

## 2014-08-03 NOTE — Progress Notes (Signed)
   Subjective:    Patient ID: Shawn Norris, male    DOB: Dec 29, 1955, 59 y.o.   MRN: [id number]  HPI He has had difficulty over the last 3 weeks with a pruritic lesion on his mid low back area. He does not remember any insect bites or lesions anywhere else.   Review of Systems     Objective:   Physical Exam A 1 cm erythematous lesion is noted in the mid low back area. It is slightly raised. No other lesions were noted.     Assessment & Plan:  Skin lesion of back The lesion was injected with Xylocaine and epinephrine. A 2 mm punch biopsy was obtained.

## 2014-08-05 ENCOUNTER — Encounter: Payer: Self-pay | Admitting: Family Medicine

## 2014-08-13 ENCOUNTER — Other Ambulatory Visit: Payer: Self-pay | Admitting: Family Medicine

## 2014-08-13 NOTE — Telephone Encounter (Signed)
Is this okay?

## 2014-08-25 ENCOUNTER — Other Ambulatory Visit: Payer: Self-pay | Admitting: Family Medicine

## 2014-08-26 ENCOUNTER — Other Ambulatory Visit: Payer: Self-pay | Admitting: Family Medicine

## 2014-09-09 ENCOUNTER — Other Ambulatory Visit: Payer: Self-pay | Admitting: Family Medicine

## 2014-09-09 NOTE — Telephone Encounter (Signed)
ok 

## 2014-09-09 NOTE — Telephone Encounter (Signed)
Is this okay to refill? 

## 2014-10-08 ENCOUNTER — Other Ambulatory Visit: Payer: Self-pay | Admitting: Family Medicine

## 2014-10-21 ENCOUNTER — Other Ambulatory Visit: Payer: Self-pay | Admitting: Family Medicine

## 2014-11-12 ENCOUNTER — Other Ambulatory Visit: Payer: Self-pay

## 2014-11-12 ENCOUNTER — Other Ambulatory Visit: Payer: Self-pay | Admitting: Family Medicine

## 2014-11-12 NOTE — Telephone Encounter (Signed)
Ok to renew?  

## 2014-11-12 NOTE — Telephone Encounter (Signed)
IS THIS OKAY

## 2014-12-07 ENCOUNTER — Other Ambulatory Visit (INDEPENDENT_AMBULATORY_CARE_PROVIDER_SITE_OTHER): Payer: No Typology Code available for payment source

## 2014-12-07 DIAGNOSIS — Z23 Encounter for immunization: Secondary | ICD-10-CM | POA: Diagnosis not present

## 2014-12-24 ENCOUNTER — Other Ambulatory Visit: Payer: Self-pay | Admitting: Family Medicine

## 2014-12-24 NOTE — Telephone Encounter (Signed)
Is the paxil okay

## 2015-02-16 ENCOUNTER — Other Ambulatory Visit: Payer: Self-pay | Admitting: Family Medicine

## 2015-02-16 NOTE — Telephone Encounter (Signed)
Is this okay to refill? 

## 2015-02-17 ENCOUNTER — Other Ambulatory Visit: Payer: Self-pay | Admitting: Family Medicine

## 2015-02-18 ENCOUNTER — Telehealth: Payer: Self-pay | Admitting: Family Medicine

## 2015-02-18 NOTE — Telephone Encounter (Signed)
Is this ok to refill?  

## 2015-02-18 NOTE — Telephone Encounter (Signed)
Pt states we denied his azaTHIOprine & states Dr. Benson Norway originally prescribed it but that you had filled it before. (checked & you have filled it before)  Only has 2 left & would like refill

## 2015-02-20 ENCOUNTER — Other Ambulatory Visit: Payer: Self-pay | Admitting: Family Medicine

## 2015-02-23 NOTE — Telephone Encounter (Signed)
Is this okay to refill? 

## 2015-02-24 NOTE — Telephone Encounter (Signed)
Per JCL denied and  pt was informed

## 2015-04-19 ENCOUNTER — Telehealth: Payer: Self-pay | Admitting: Family Medicine

## 2015-04-19 NOTE — Telephone Encounter (Signed)
Needs refill Ambien Change pharmacy to [facility]

## 2015-04-19 NOTE — Telephone Encounter (Signed)
Okay to renew

## 2015-04-20 ENCOUNTER — Other Ambulatory Visit: Payer: Self-pay | Admitting: *Deleted

## 2015-04-20 MED ORDER — ZOLPIDEM TARTRATE 10 MG PO TABS: 10.0000 mg | ORAL_TABLET | Freq: Every evening | ORAL | Status: DC | PRN

## 2015-06-07 ENCOUNTER — Telehealth: Payer: Self-pay | Admitting: Family Medicine

## 2015-06-07 DIAGNOSIS — I10 Essential (primary) hypertension: Secondary | ICD-10-CM

## 2015-06-07 DIAGNOSIS — E785 Hyperlipidemia, unspecified: Secondary | ICD-10-CM

## 2015-06-07 DIAGNOSIS — F341 Dysthymic disorder: Secondary | ICD-10-CM

## 2015-06-07 DIAGNOSIS — Z1159 Encounter for screening for other viral diseases: Secondary | ICD-10-CM

## 2015-06-07 DIAGNOSIS — Z8679 Personal history of other diseases of the circulatory system: Secondary | ICD-10-CM

## 2015-06-07 NOTE — Telephone Encounter (Signed)
Pt would like to come in morning for fasting labs, please put in orders if this is ok

## 2015-06-07 NOTE — Telephone Encounter (Signed)
Ok

## 2015-06-08 ENCOUNTER — Other Ambulatory Visit: Payer: Managed Care, Other (non HMO)

## 2015-06-08 DIAGNOSIS — Z1159 Encounter for screening for other viral diseases: Secondary | ICD-10-CM

## 2015-06-08 DIAGNOSIS — Z8679 Personal history of other diseases of the circulatory system: Secondary | ICD-10-CM

## 2015-06-08 DIAGNOSIS — E785 Hyperlipidemia, unspecified: Secondary | ICD-10-CM

## 2015-06-08 DIAGNOSIS — I10 Essential (primary) hypertension: Secondary | ICD-10-CM

## 2015-06-08 LAB — LIPID PANEL
Cholesterol: 162 mg/dL (ref 125–200)
HDL: 61 mg/dL (ref >=40)
LDL Cholesterol: 89 mg/dL (ref <130)
Total CHOL/HDL Ratio: 2.7 Ratio (ref <=5.0)
Triglycerides: 59 mg/dL (ref <150)
VLDL: 12 mg/dL (ref <30)

## 2015-06-08 LAB — CBC WITH DIFFERENTIAL/PLATELET
Basophils Absolute: 32 cells/uL (ref 0–200)
Basophils Relative: 1 %
Eosinophils Absolute: 64 cells/uL (ref 15–500)
Eosinophils Relative: 2 %
HCT: 41.7 % (ref 38.5–50.0)
Hemoglobin: 14.1 g/dL (ref 13.2–17.1)
Lymphocytes Relative: 33 %
Lymphs Abs: 1056 cells/uL (ref 850–3900)
MCH: 30.5 pg (ref 27.0–33.0)
MCHC: 33.8 g/dL (ref 32.0–36.0)
MCV: 90.3 fL (ref 80.0–100.0)
MPV: 11 fL (ref 7.5–12.5)
Monocytes Absolute: 320 cells/uL (ref 200–950)
Monocytes Relative: 10 %
Neutro Abs: 1728 cells/uL (ref 1500–7800)
Neutrophils Relative %: 54 %
Platelets: 226 10*3/uL (ref 140–400)
RBC: 4.62 MIL/uL (ref 4.20–5.80)
RDW: 13.8 % (ref 11.0–15.0)
WBC: 3.2 10*3/uL — ABNORMAL LOW (ref 4.0–10.5)

## 2015-06-08 LAB — COMPREHENSIVE METABOLIC PANEL
ALT: 17 U/L (ref 9–46)
AST: 20 U/L (ref 10–35)
Albumin: 4.1 g/dL (ref 3.6–5.1)
Alkaline Phosphatase: 23 U/L — ABNORMAL LOW (ref 40–115)
BUN: 20 mg/dL (ref 7–25)
CO2: 27 mmol/L (ref 20–31)
Calcium: 9 mg/dL (ref 8.6–10.3)
Chloride: 103 mmol/L (ref 98–110)
Creat: 1.03 mg/dL (ref 0.70–1.25)
Glucose, Bld: 98 mg/dL (ref 65–99)
Potassium: 4 mmol/L (ref 3.5–5.3)
Sodium: 142 mmol/L (ref 135–146)
Total Bilirubin: 0.5 mg/dL (ref 0.2–1.2)
Total Protein: 6.5 g/dL (ref 6.1–8.1)

## 2015-06-09 LAB — PSA: PSA: 0.97 ng/mL (ref <=4.00)

## 2015-06-09 LAB — HEPATITIS C ANTIBODY: HCV Ab: NEGATIVE

## 2015-06-11 ENCOUNTER — Other Ambulatory Visit: Payer: Self-pay

## 2015-06-11 ENCOUNTER — Telehealth: Payer: Self-pay | Admitting: Family Medicine

## 2015-06-11 NOTE — Telephone Encounter (Signed)
Called in.

## 2015-06-11 NOTE — Telephone Encounter (Signed)
Okay to renew

## 2015-06-11 NOTE — Telephone Encounter (Signed)
Called in ambien per jcl 

## 2015-06-11 NOTE — Telephone Encounter (Signed)
Rcvd refill request for Zolpidem 10 mg

## 2015-06-21 ENCOUNTER — Ambulatory Visit (INDEPENDENT_AMBULATORY_CARE_PROVIDER_SITE_OTHER): Payer: Managed Care, Other (non HMO) | Admitting: Family Medicine

## 2015-06-21 ENCOUNTER — Other Ambulatory Visit: Payer: Self-pay

## 2015-06-21 ENCOUNTER — Encounter: Payer: Self-pay | Admitting: Family Medicine

## 2015-06-21 VITALS — BP 120/70 | HR 70 | Wt 150.0 lb

## 2015-06-21 DIAGNOSIS — F341 Dysthymic disorder: Secondary | ICD-10-CM

## 2015-06-21 DIAGNOSIS — I10 Essential (primary) hypertension: Secondary | ICD-10-CM

## 2015-06-21 DIAGNOSIS — J301 Allergic rhinitis due to pollen: Secondary | ICD-10-CM | POA: Diagnosis not present

## 2015-06-21 DIAGNOSIS — K50019 Crohn's disease of small intestine with unspecified complications: Secondary | ICD-10-CM

## 2015-06-21 DIAGNOSIS — Z8679 Personal history of other diseases of the circulatory system: Secondary | ICD-10-CM | POA: Diagnosis not present

## 2015-06-21 DIAGNOSIS — E7800 Pure hypercholesterolemia, unspecified: Secondary | ICD-10-CM | POA: Diagnosis not present

## 2015-06-21 DIAGNOSIS — E785 Hyperlipidemia, unspecified: Secondary | ICD-10-CM

## 2015-06-21 MED ORDER — LISINOPRIL-HYDROCHLOROTHIAZIDE 10-12.5 MG PO TABS: 1.0000 | ORAL_TABLET | Freq: Every day | ORAL | Status: DC

## 2015-06-21 MED ORDER — VERAPAMIL HCL ER 360 MG PO CP24: ORAL_CAPSULE | ORAL | Status: DC

## 2015-06-21 MED ORDER — SIMVASTATIN 40 MG PO TABS: 40.0000 mg | ORAL_TABLET | Freq: Every day | ORAL | Status: DC

## 2015-06-21 MED ORDER — PAROXETINE HCL 20 MG PO TABS: 20.0000 mg | ORAL_TABLET | Freq: Every morning | ORAL | Status: DC

## 2015-06-21 NOTE — Progress Notes (Signed)
   Subjective:    Patient ID: Shawn Norris, male    DOB: 1955-05-20, 60 y.o.   MRN: [id number]  HPI He is here for an interval evaluation. He does have a previous history of PSVT and has been evaluated by  Cardiology. He has not had any difficulty with His heart in quite some time. He also has a history of Crohn's disease and sees Dr. Benson Norway on a regular basis. He continues on His present medication and is doing well on this. He does have underlying allergies and does use Flonase as well as a topical antihistamine. He uses antihistamine on an as-needed basis. He does have underlying dysthymia and is doing fairly well on his Paxil. He does use Ativan but very rarely. He is using Ambien roughly twice per week. He is sexually active.   Review of Systems     Objective:   Physical Exam Alert and in no distress. Tympanic membranes and canals are normal. Pharyngeal area is normal. Neck is supple without adenopathy or thyromegaly. Cardiac exam shows a regular sinus rhythm without murmurs or gallops. Lungs are clear to auscultation.Donald exam shows no masses or tenderness with decreased bowel sounds. Recent blood work was reviewed with him.        Assessment & Plan:  History of PSVT (paroxysmal supraventricular tachycardia) - Plan: verapamil (VERELAN PM) 360 MG 24 hr capsule  Essential hypertension - Plan: lisinopril-hydrochlorothiazide (PRINZIDE,ZESTORETIC) 10-12.5 MG tablet  Hyperlipidemia LDL goal <100  Crohn's disease of small intestine with complication (HCC)  Allergic rhinitis due to pollen  Dysthymia  Hypercholesterolemia - Plan: simvastatin (ZOCOR) 40 MG tablet He'll continue on his present medication regimen. Did recommend using Flonase on a regular basis. Also discussed the use of the Ambien. Gave him pointers on trying to reduce the use of this medication.

## 2015-07-09 ENCOUNTER — Telehealth: Payer: Self-pay

## 2015-07-09 ENCOUNTER — Other Ambulatory Visit: Payer: Self-pay

## 2015-07-09 MED ORDER — ZOLPIDEM TARTRATE 10 MG PO TABS: 10.0000 mg | ORAL_TABLET | Freq: Every evening | ORAL | Status: DC | PRN

## 2015-07-09 NOTE — Telephone Encounter (Signed)
Okay to renew

## 2015-07-09 NOTE — Telephone Encounter (Signed)
Fax rcvd for zolpidem 17m from [facility] Last filled 04/20/15 # 30.  /Victorino December

## 2015-07-09 NOTE — Telephone Encounter (Signed)
Called in med

## 2015-09-22 ENCOUNTER — Other Ambulatory Visit: Payer: Managed Care, Other (non HMO)

## 2015-09-22 DIAGNOSIS — K50919 Crohn's disease, unspecified, with unspecified complications: Secondary | ICD-10-CM

## 2015-09-22 LAB — CBC WITH DIFFERENTIAL/PLATELET
Basophils Absolute: 41 cells/uL (ref 0–200)
Basophils Relative: 1 %
Eosinophils Absolute: 41 cells/uL (ref 15–500)
Eosinophils Relative: 1 %
HCT: 41.4 % (ref 38.5–50.0)
Hemoglobin: 14.3 g/dL (ref 13.2–17.1)
Lymphocytes Relative: 21 %
Lymphs Abs: 861 cells/uL (ref 850–3900)
MCH: 30.4 pg (ref 27.0–33.0)
MCHC: 34.5 g/dL (ref 32.0–36.0)
MCV: 88.1 fL (ref 80.0–100.0)
MPV: 11.1 fL (ref 7.5–12.5)
Monocytes Absolute: 328 cells/uL (ref 200–950)
Monocytes Relative: 8 %
Neutro Abs: 2829 cells/uL (ref 1500–7800)
Neutrophils Relative %: 69 %
Platelets: 235 10*3/uL (ref 140–400)
RBC: 4.7 MIL/uL (ref 4.20–5.80)
RDW: 14 % (ref 11.0–15.0)
WBC: 4.1 10*3/uL (ref 4.0–10.5)

## 2015-12-20 ENCOUNTER — Other Ambulatory Visit: Payer: Self-pay | Admitting: Family Medicine

## 2015-12-20 NOTE — Telephone Encounter (Signed)
Called in Ambien per jcl

## 2015-12-20 NOTE — Telephone Encounter (Signed)
Is this okay to refrill

## 2015-12-20 NOTE — Telephone Encounter (Signed)
Okay 

## 2016-01-26 ENCOUNTER — Other Ambulatory Visit: Payer: Self-pay | Admitting: Family Medicine

## 2016-05-17 ENCOUNTER — Other Ambulatory Visit: Payer: Self-pay | Admitting: Family Medicine

## 2016-05-17 DIAGNOSIS — M25511 Pain in right shoulder: Secondary | ICD-10-CM

## 2016-05-18 ENCOUNTER — Ambulatory Visit
Admission: RE | Admit: 2016-05-18 | Discharge: 2016-05-18 | Disposition: A | Discharge status: Home / Self Care | Payer: Self-pay | Source: Ambulatory Visit | Attending: Family Medicine | Admitting: Family Medicine

## 2016-05-18 DIAGNOSIS — M25511 Pain in right shoulder: Secondary | ICD-10-CM | POA: Diagnosis not present

## 2016-05-18 DIAGNOSIS — S4991XA Unspecified injury of right shoulder and upper arm, initial encounter: Secondary | ICD-10-CM | POA: Diagnosis not present

## 2016-05-22 ENCOUNTER — Ambulatory Visit: Payer: BLUE CROSS/BLUE SHIELD | Admitting: Sports Medicine

## 2016-05-25 ENCOUNTER — Ambulatory Visit (INDEPENDENT_AMBULATORY_CARE_PROVIDER_SITE_OTHER): Payer: BLUE CROSS/BLUE SHIELD | Admitting: Sports Medicine

## 2016-05-25 ENCOUNTER — Encounter: Payer: Self-pay | Admitting: Sports Medicine

## 2016-05-25 ENCOUNTER — Ambulatory Visit (INDEPENDENT_AMBULATORY_CARE_PROVIDER_SITE_OTHER): Payer: BLUE CROSS/BLUE SHIELD

## 2016-05-25 VITALS — BP 122/76 | HR 75 | Ht 65.5 in | Wt 147.4 lb

## 2016-05-25 DIAGNOSIS — M7550 Bursitis of unspecified shoulder: Secondary | ICD-10-CM | POA: Diagnosis not present

## 2016-05-25 DIAGNOSIS — M75101 Unspecified rotator cuff tear or rupture of right shoulder, not specified as traumatic: Secondary | ICD-10-CM

## 2016-05-25 DIAGNOSIS — M25511 Pain in right shoulder: Secondary | ICD-10-CM

## 2016-05-25 NOTE — Assessment & Plan Note (Signed)
Would like to see how he responds to the injection today.  Referral placed to Dr. Barbaraann Barthel for stage I rotator cuff rehab.  Reevaluation in 10 days for consideration of transition to nitroglycerin protocol but due to the swelling and pain believe the majority of his symptoms are coming from the acute Flector phase needs to be addressed initially.  Ultimately he will have several months of anticipated symptoms however I do anticipate him progressing quite well and hopefully being able to return to golf in time for his upcoming trip in May.

## 2016-05-25 NOTE — Progress Notes (Signed)
OFFICE VISIT NOTE Shawn Norris. Shawn Norris, [place] [facility] [place] - 61 y.o. male MRN [id number]  Date of birth: 04-25-1955  Visit Date: 05/25/2016  PCP: Wyatt Haste, MD   Referred by: Denita Lung, MD  SUBJECTIVE:   Chief Complaint  Patient presents with  . pain in right shoulder    Pain in right shoulder x 2 weeks. He slipped and caught himself on his right hip and below elbow. Pain in mostly on the front of the shoulder and it was swollen at first but now the swelling has gone down. The soreness is worse when raising the right arm. Pain is about a 3 now but was an 8 when injury first occured. He has iced the shoulder and taken Advil with some relief.    HPI: As above. Additional pertinent information includes:  Referred by Dr. Redmond School for right shoulder pain. Fell on right elbow. Does not believe he had a transient dislocation. No numbness/tingling/burning. Hasn't returned to golf but has upcoming trip in May. Right hand dominant No prior significant shoulder problems Marked anterior swelling reported but now improved. Ibuprofen has been mildly helpful   ROS:  Otherwise per HPI.  HISTORY & PERTINENT PRIOR DATA:  No specialty comments available. He reports that he has never smoked. He has never used smokeless tobacco. No results for input(s): HGBA1C, LABURIC in the last 8760 hours. Medications & Allergies reviewed per EMR Patient Active Problem List   Diagnosis Date Noted  . Acute pain of right shoulder 05/25/2016  . Allergic rhinitis 06/17/2013  . Dysthymia 06/17/2013  . Actinic keratosis of scalp 01/08/2013  . History of PSVT (paroxysmal supraventricular tachycardia)   . Hypertension 07/03/2011  . Hyperlipidemia LDL goal <100 07/03/2011  . Crohn's disease ([place]) 07/03/2011   Past Medical History:  Diagnosis Date  . Anxiety   . Arthritis   . BPH (benign prostatic hypertrophy)   . Crohn's    . Dysuria   . History of PSVT (paroxysmal supraventricular tachycardia)   . Hypercholesterolemia    May 2013 - TC 152, TG 48, HDL 60, LDL 82  . Hypertension   . Inguinal hernia   . Palpitations   . Prostatitis   . Varicocele    No family history on file. Past Surgical History:  Procedure Laterality Date  . ACHILLES TENDON REPAIR    . INGUINAL HERNIA REPAIR     Social History   Occupational History  . Not on file.   Social History Main Topics  . Smoking status: Never Smoker  . Smokeless tobacco: Never Used  . Alcohol use 2.4 oz/week    4 Glasses of wine per week  . Drug use: No  . Sexual activity: Yes    OBJECTIVE:  VS:  HT:5' 5.5" (166.4 cm)   WT:147 lb 6.4 oz (66.9 kg)  BMI:24.2    BP:122/76  HR:75bpm  TEMP: ( )  RESP:98 % Physical Exam  Constitutional: He appears well-developed and well-nourished. He is cooperative.  Non-toxic appearance.  HENT:  Head: Normocephalic and atraumatic.  Cardiovascular: Intact distal pulses.   Pulmonary/Chest: No accessory muscle usage. No respiratory distress.  Neurological: He is alert. He is not disoriented. He displays normal reflexes. No sensory deficit.  Skin: Skin is warm, dry and intact. Capillary refill takes less than 2 seconds. No abrasion and no rash noted.  Psychiatric: He has a normal mood and affect. His speech is normal and  behavior is normal. Thought content normal.   Right Shoulder:   Normal alignment. No significant deformity.   Mild TTP of the anterior deltoid.  Otherwise bony prominences are nontender.  Limited overhead motion by approximately 15 of abduction and.  Mild scapulothoracic dysfunction.  Internal rotation strength is 5 out of 5.  External rotation strength is 3 out of 5 with pain.  Localizing to the posterior superior shoulder.  Mild pain with Hawkins.  Minimal pain and strength intact with speeds testing and empty can testing.  Moderate pain with Yergason's testing.      IMAGING &  PROCEDURES: Dg Shoulder Right  Result Date: 05/18/2016 CLINICAL DATA:  Right shoulder pain after fall onto elbow 6 days ago. Initial encounter. EXAM: RIGHT SHOULDER - 2+ VIEW COMPARISON:  None. FINDINGS: Negative for fracture or dislocation. Tiny corticated ossific structures below the glenoid appear chronic. Mild acromioclavicular joint spurring. Negative visualized right chest. IMPRESSION: 1. No acute finding. 2. Small chronic appearing ossicles or intra-articular bodies along the inferior glenohumeral recess. Electronically Signed   By: Monte Fantasia M.D.   On: 05/18/2016 11:18   No additional findings.   LIMITED MSK ULTRASOUND OF RIGHT SHOULDER Images were obtained and interpreted by myself, Teresa Coombs, DO  Images have been saved and stored to PACS system. Images obtained on: GE S7 Ultrasound machine  FINDINGS:  Biceps Tendon: Normal biceps tendon with large overlying anterior bursal swelling.  Pain with sono palpation.  Biceps tendon does appear intact. Pec Major Insertion: Normal Subscapularis Tendon: Fibers are intact.  Skin overlying subdeltoid bursa Supraspinatus Tendon: There is tearing of the supraspinous tendon although this does not show any evidence of retraction at rest or with resisted external rotation or abduction.  There is neovascularity within this area and a small degree of edema within the subdeltoid bursa. Infraspinatus/Teres Minor Tendon: Normal AC Joint: Minimal degenerative change JOINT: No significant GH spurring appreciated  LABRUM: Incompletely evaluated with unremarkable.  Posterior/inferior the views did not reveal any findings consistent with the calcific change seen on x-ray.    IMPRESSION:  1. Supraspinatus tear of the posterior fibers without evidence of retraction and underlying supraspinatus tendinopathy 2. Anterior subdeltoid swelling consistent with  bursitis  +++++++++++++++++++++++++++++++++++++++++++++++++++++++++++++++++++++++++++++  PROCEDURE NOTE: ULTRASOUND GUIDED RIGHT SHOULDERINJECTION  Images were obtained and interpreted by myself, Teresa Coombs, DO  Images have been saved and stored to PACS system. Images obtained on: GE S7 Ultrasound machine  DESCRIPTION OF PROCEDURE:  The patient's clinical condition is marked by substantial pain and/or significant functional disability. Other conservative therapy has not provided relief, is contraindicated, or not appropriate. There is a reasonable likelihood that injection will significantly improve the patient's pain and/or functional impairment. After discussing the risks, benefits and expected outcomes of the injection and all questions were reviewed and answered, the patient wished to undergo the above named procedure. Verbal consent was obtained. The ultrasound was used to identify the target structure and adjacent neurovascular structures. The skin was then prepped in sterile fashion and the target structure was injected under direct visualization using sterile technique as below: PREP: Alcohol, Ethel Chloride APPROACH: Anterior US Guided Bursal Injection, 22g 1.5" needle INJECTATE: 2 cc 0.5% marcaine, 1cc 29m DepoMedrol ASPIRATE: N/A DRESSING: Band-Aid  Post procedural instructions including recommending icing and warning signs for infection were reviewed. This procedure was well tolerated and there were no complications.   IMPRESSION: Succesful [place]ided Injection    ASSESSMENT & PLAN:  Visit Diagnoses:  1. Acute pain of right  shoulder   2. Subacromial bursitis   3. Rotator cuff syndrome of right shoulder    Meds: No orders of the defined types were placed in this encounter.   Orders:  Orders Placed This Encounter  Procedures  . [place] LIMITED JOINT SPACE STRUCTURES UP RIGHT    Follow-up: Return in about 10 days (around 06/04/2016).   Otherwise please see problem  oriented charting as below.

## 2016-05-29 ENCOUNTER — Other Ambulatory Visit: Payer: Self-pay | Admitting: Family Medicine

## 2016-05-29 NOTE — Telephone Encounter (Signed)
Is this okay to refill? 

## 2016-05-29 NOTE — Telephone Encounter (Signed)
Called ambien in per [employer]

## 2016-05-29 NOTE — Telephone Encounter (Signed)
ok 

## 2016-06-05 ENCOUNTER — Ambulatory Visit (INDEPENDENT_AMBULATORY_CARE_PROVIDER_SITE_OTHER): Payer: BLUE CROSS/BLUE SHIELD | Admitting: Sports Medicine

## 2016-06-05 ENCOUNTER — Encounter: Payer: Self-pay | Admitting: Sports Medicine

## 2016-06-05 ENCOUNTER — Ambulatory Visit: Payer: BLUE CROSS/BLUE SHIELD | Admitting: Family Medicine

## 2016-06-05 ENCOUNTER — Ambulatory Visit: Payer: Self-pay

## 2016-06-05 VITALS — BP 130/82 | HR 64 | Ht 66.0 in | Wt 148.2 lb

## 2016-06-05 DIAGNOSIS — M75101 Unspecified rotator cuff tear or rupture of right shoulder, not specified as traumatic: Secondary | ICD-10-CM | POA: Diagnosis not present

## 2016-06-05 DIAGNOSIS — M7550 Bursitis of unspecified shoulder: Secondary | ICD-10-CM | POA: Diagnosis not present

## 2016-06-05 DIAGNOSIS — M25511 Pain in right shoulder: Secondary | ICD-10-CM | POA: Diagnosis not present

## 2016-06-05 MED ORDER — NITROGLYCERIN 0.2 MG/HR TD PT24: MEDICATED_PATCH | TRANSDERMAL | 1 refills | Status: DC

## 2016-06-05 NOTE — Progress Notes (Signed)
OFFICE VISIT NOTE Shawn Norris, [place] [facility] [place] - 61 y.o. male MRN [id number]  Date of birth: 1955/03/03  Visit Date: 06/05/2016  PCP: Wyatt Haste, MD   Referred by: Denita Lung, MD  SUBJECTIVE:   Chief Complaint  Patient presents with  . pain in right shoulder    Patient responding well to procedure. Pain described ranging from 0-4. Minor pain with over head flexion and abduction/extension. Patient has not done any treatment (meds, heat/ice) when pain does arise after activity. Patient has not seen PT due to scheduling conflicts.   HPI: As above. Additional pertinent information includes:  Patient reports his pain is significantly improved.  Small amount of anterior lateral pain that is present with overhead motion.  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  He has not been able to get scheduled in physical therapy but is been set up at [facility] [address] for later this week.  No significant night pain.  Symptoms improved rapidly after injection.  No mechanical symptoms, no significant weakness.  Pain has continued to be present with upright row type motions including pulling the bed sheets up.   ROS: ROS  Otherwise per HPI.  HISTORY & PERTINENT PRIOR DATA:  No specialty comments available. He reports that he has never smoked. He has never used smokeless tobacco. No results for input(s): HGBA1C, LABURIC in the last 8760 hours. Medications & Allergies reviewed per EMR Patient Active Problem List   Diagnosis Date Noted  . Rotator cuff syndrome of right shoulder 06/05/2016  . Acute pain of right shoulder 05/25/2016  . Allergic rhinitis 06/17/2013  . Dysthymia 06/17/2013  . Actinic keratosis of scalp 01/08/2013  . History of PSVT (paroxysmal supraventricular tachycardia)   . Hypertension 07/03/2011  . Hyperlipidemia LDL goal <100 07/03/2011  . Crohn's  disease ([place]) 07/03/2011   Past Medical History:  Diagnosis Date  . Anxiety   . Arthritis   . BPH (benign prostatic hypertrophy)   . Crohn's   . Dysuria   . History of PSVT (paroxysmal supraventricular tachycardia)   . Hypercholesterolemia    May 2013 - TC 152, TG 48, HDL 60, LDL 82  . Hypertension   . Inguinal hernia   . Palpitations   . Prostatitis   . Varicocele    No family history on file. Past Surgical History:  Procedure Laterality Date  . ACHILLES TENDON REPAIR    . INGUINAL HERNIA REPAIR     Social History   Occupational History  . Not on file.   Social History Main Topics  . Smoking status: Never Smoker  . Smokeless tobacco: Never Used  . Alcohol use 2.4 oz/week    4 Glasses of wine per week  . Drug use: No  . Sexual activity: Yes    OBJECTIVE:  VS:  HT:5' 6"  (167.6 cm)   WT:148 lb 3.2 oz (67.2 kg)  BMI:24    BP:130/82  HR:64bpm  TEMP: ( )  RESP:97 % Physical Exam  Constitutional: He appears well-developed and well-nourished. He is cooperative.  Non-toxic appearance.  HENT:  Head: Normocephalic and atraumatic.  Cardiovascular: Intact distal pulses.   Pulmonary/Chest: No accessory muscle usage. No respiratory distress.  Neurological: He is alert. He is not disoriented. He displays normal reflexes. No sensory deficit.  Skin: Skin is warm, dry and intact. Capillary refill takes less than 2 seconds. No abrasion  and no rash noted.  Psychiatric: He has a normal mood and affect. His speech is normal and behavior is normal. Thought content normal.    Right Shoulder:   Normal alignment. No significant deformity.   Only mildly tender over the biceps tendon.  No bony tenderness.  No pain over the deltoid pectoral groove.   Limited abduction and extension by 10 compared to the left but normal internal rotation.  Strength is 5 out of 5 with internal rotation, external rotation, empty can testing, speeds testing and Yergason's.  He does have a moderate  degree of pain with Yergason's testing.  Mild pain with Hawkins and Neer's but this is minimal.   IMAGING & PROCEDURES: Dg Shoulder Right  Result Date: 05/18/2016 CLINICAL DATA:  Right shoulder pain after fall onto elbow 6 days ago. Initial encounter. EXAM: RIGHT SHOULDER - 2+ VIEW COMPARISON:  None. FINDINGS: Negative for fracture or dislocation. Tiny corticated ossific structures below the glenoid appear chronic. Mild acromioclavicular joint spurring. Negative visualized right chest. IMPRESSION: 1. No acute finding. 2. Small chronic appearing ossicles or intra-articular bodies along the inferior glenohumeral recess. Electronically Signed   By: Monte Fantasia M.D.   On: 05/18/2016 11:18   No additional findings.  LIMITED MSK ULTRASOUND OF RIGHT SHOULDER Images were obtained and interpreted by myself, Teresa Coombs, DO  Images have been saved and stored to PACS system. Images obtained on: GE S7 Ultrasound machine  FINDINGS:  Biceps Tendon: Small amount of associated hypoechoic change but intact tendon.   Pec Major Insertion: Normal Subscapularis Tendon: Normal Supraspinatus Tendon: There is a small subacromial bursa that is still present with marked thickening of the anterior fibers of the supraspinatus.  No retraction with contraction. Infraspinatus/Teres Minor Tendon: Normal AC Joint: Slightly degenerative JOINT: No significant GH spurring appreciated  LABRUM: Not evaluated  IMPRESSION:  1. Small amount of subacromial bursitis 2. Supraspinatus anterior fiber tendinopathy without retraction   ASSESSMENT & PLAN:  Visit Diagnoses:  1. Acute pain of right shoulder   2. Subacromial bursitis   3. Rotator cuff syndrome of right shoulder    Meds:  Meds ordered this encounter  Medications  . nitroGLYCERIN (NITRODUR - DOSED IN MG/24 HR) 0.2 mg/hr patch    Sig: Place 1/4 of patch over affected region. Remove and replace once daily.  Slightly alter skin placement daily    Dispense:   30 patch    Refill:  1    Orders:  Orders Placed This Encounter  Procedures  . [place] LIMITED JOINT SPACE STRUCTURES UP RIGHT(NO LINKED CHARGES)    Follow-up: No Follow-up on file.   Otherwise please see problem oriented charting as below.

## 2016-06-05 NOTE — Progress Notes (Signed)
PROCEDURE NOTE: THERAPEUTIC EXERCISES (97110) 15 minutes spent for Therapeutic exercises as stated in above notes.  This included exercises focusing on stretching, strengthening, with significant focus on eccentric aspects.   Proper technique shown and discussed handout in great detail with ATC.  All questions were discussed and answered.

## 2016-06-05 NOTE — Patient Instructions (Signed)
Please perform the exercise program that Jeneen Rinks has prepared for you and gone over in detail on a daily basis.  In addition to the handout you were provided you can access your program through: [email or web address]   Your unique program code is: [email or web address]   Nitroglycerin Protocol   Apply 1/4 nitroglycerin patch to affected area daily.  Change position of patch within the affected area every 24 hours.  You may experience a headache during the first 1-2 weeks of using the patch, these should subside.  If you experience headaches after beginning nitroglycerin patch treatment, you may take your preferred over the counter pain reliever.  Another side effect of the nitroglycerin patch is skin irritation or rash related to patch adhesive.  Please notify our office if you develop more severe headaches or rash, and stop the patch.  Tendon healing with nitroglycerin patch may require 12 to 24 weeks depending on the extent of injury.  Men should not use if taking Viagra, Cialis, or Levitra.   Do not use if you have migraines or rosacea.

## 2016-06-05 NOTE — Assessment & Plan Note (Signed)
Anterior fiber tendinopathy of the supraspinatus.  Nitroglycerin protocol.  Therapeutic exercises focused on eccentric phase and neuromuscular recruitment exercises.  Okay to begin gently swinging a golf club over the next 2 weeks.  If no significant worsening okay to progress back to activities as tolerated but will need to recheck in 6 weeks the progress on the anterior fiber impingement.  Plan for repeat ultrasound at that time.

## 2016-06-07 DIAGNOSIS — M25511 Pain in right shoulder: Secondary | ICD-10-CM | POA: Diagnosis not present

## 2016-06-07 DIAGNOSIS — M6281 Muscle weakness (generalized): Secondary | ICD-10-CM | POA: Diagnosis not present

## 2016-06-13 DIAGNOSIS — M6281 Muscle weakness (generalized): Secondary | ICD-10-CM | POA: Diagnosis not present

## 2016-06-13 DIAGNOSIS — M25511 Pain in right shoulder: Secondary | ICD-10-CM | POA: Diagnosis not present

## 2016-06-21 ENCOUNTER — Other Ambulatory Visit: Payer: Self-pay | Admitting: Family Medicine

## 2016-07-10 ENCOUNTER — Telehealth: Payer: Self-pay

## 2016-07-10 ENCOUNTER — Other Ambulatory Visit: Payer: BLUE CROSS/BLUE SHIELD

## 2016-07-10 DIAGNOSIS — Z209 Contact with and (suspected) exposure to unspecified communicable disease: Secondary | ICD-10-CM

## 2016-07-10 DIAGNOSIS — I10 Essential (primary) hypertension: Secondary | ICD-10-CM | POA: Diagnosis not present

## 2016-07-10 DIAGNOSIS — K50019 Crohn's disease of small intestine with unspecified complications: Secondary | ICD-10-CM

## 2016-07-10 DIAGNOSIS — Z1159 Encounter for screening for other viral diseases: Secondary | ICD-10-CM | POA: Diagnosis not present

## 2016-07-10 DIAGNOSIS — Z8679 Personal history of other diseases of the circulatory system: Secondary | ICD-10-CM

## 2016-07-10 DIAGNOSIS — E785 Hyperlipidemia, unspecified: Secondary | ICD-10-CM

## 2016-07-10 LAB — CBC WITH DIFFERENTIAL/PLATELET
Basophils Absolute: 42 cells/uL (ref 0–200)
Basophils Relative: 1 %
Eosinophils Absolute: 42 cells/uL (ref 15–500)
Eosinophils Relative: 1 %
HCT: 42.9 % (ref 38.5–50.0)
Hemoglobin: 14.4 g/dL (ref 13.2–17.1)
Lymphocytes Relative: 25 %
Lymphs Abs: 1050 cells/uL (ref 850–3900)
MCH: 30.4 pg (ref 27.0–33.0)
MCHC: 33.6 g/dL (ref 32.0–36.0)
MCV: 90.7 fL (ref 80.0–100.0)
MPV: 10.5 fL (ref 7.5–12.5)
Monocytes Absolute: 378 cells/uL (ref 200–950)
Monocytes Relative: 9 %
Neutro Abs: 2688 cells/uL (ref 1500–7800)
Neutrophils Relative %: 64 %
Platelets: 224 10*3/uL (ref 140–400)
RBC: 4.73 MIL/uL (ref 4.20–5.80)
RDW: 13.7 % (ref 11.0–15.0)
WBC: 4.2 10*3/uL (ref 4.0–10.5)

## 2016-07-10 NOTE — Telephone Encounter (Signed)
Dr.lalonde can you please put in future orders for lab visit

## 2016-07-11 ENCOUNTER — Other Ambulatory Visit: Payer: Self-pay | Admitting: Family Medicine

## 2016-07-11 DIAGNOSIS — E7800 Pure hypercholesterolemia, unspecified: Secondary | ICD-10-CM

## 2016-07-11 DIAGNOSIS — I10 Essential (primary) hypertension: Secondary | ICD-10-CM

## 2016-07-11 LAB — LIPID PANEL
Cholesterol: 168 mg/dL (ref <200)
HDL: 65 mg/dL (ref >40)
LDL Cholesterol: 89 mg/dL (ref <100)
Total CHOL/HDL Ratio: 2.6 Ratio (ref <5.0)
Triglycerides: 68 mg/dL (ref <150)
VLDL: 14 mg/dL (ref <30)

## 2016-07-11 LAB — COMPREHENSIVE METABOLIC PANEL
ALT: 16 U/L (ref 9–46)
AST: 18 U/L (ref 10–35)
Albumin: 4.3 g/dL (ref 3.6–5.1)
Alkaline Phosphatase: 26 U/L — ABNORMAL LOW (ref 40–115)
BUN: 19 mg/dL (ref 7–25)
CO2: 24 mmol/L (ref 20–31)
Calcium: 9.1 mg/dL (ref 8.6–10.3)
Chloride: 105 mmol/L (ref 98–110)
Creat: 1.01 mg/dL (ref 0.70–1.25)
Glucose, Bld: 99 mg/dL (ref 65–99)
Potassium: 4.3 mmol/L (ref 3.5–5.3)
Sodium: 139 mmol/L (ref 135–146)
Total Bilirubin: 0.8 mg/dL (ref 0.2–1.2)
Total Protein: 6.6 g/dL (ref 6.1–8.1)

## 2016-07-11 LAB — HEPATITIS C ANTIBODY: HCV Ab: NEGATIVE

## 2016-07-11 LAB — HIV ANTIBODY (ROUTINE TESTING W REFLEX): HIV 1&2 Ab, 4th Generation: NONREACTIVE

## 2016-07-18 ENCOUNTER — Ambulatory Visit: Payer: BLUE CROSS/BLUE SHIELD | Admitting: Sports Medicine

## 2016-08-17 ENCOUNTER — Encounter: Payer: Self-pay | Admitting: Family Medicine

## 2016-08-17 ENCOUNTER — Ambulatory Visit (INDEPENDENT_AMBULATORY_CARE_PROVIDER_SITE_OTHER): Payer: BLUE CROSS/BLUE SHIELD | Admitting: Family Medicine

## 2016-08-17 ENCOUNTER — Ambulatory Visit: Payer: BLUE CROSS/BLUE SHIELD | Admitting: Family Medicine

## 2016-08-17 VITALS — BP 128/80 | HR 83 | Temp 98.3°F | Wt 149.8 lb

## 2016-08-17 DIAGNOSIS — R300 Dysuria: Secondary | ICD-10-CM

## 2016-08-17 DIAGNOSIS — N410 Acute prostatitis: Secondary | ICD-10-CM

## 2016-08-17 LAB — POCT URINALYSIS DIP (PROADVANTAGE DEVICE)
Bilirubin, UA: NEGATIVE
Blood, UA: NEGATIVE
Glucose, UA: NEGATIVE mg/dL
Ketones, POC UA: NEGATIVE mg/dL
Leukocytes, UA: NEGATIVE
Nitrite, UA: NEGATIVE
Protein Ur, POC: NEGATIVE mg/dL
Specific Gravity, Urine: 1.025
Urobilinogen, Ur: NEGATIVE
pH, UA: 6 (ref 5.0–8.0)

## 2016-08-17 MED ORDER — CIPROFLOXACIN HCL 500 MG PO TABS: 500.0000 mg | ORAL_TABLET | Freq: Two times a day (BID) | ORAL | 0 refills | Status: DC

## 2016-08-17 NOTE — Addendum Note (Signed)
Addended by: Minette Headland A on: 08/17/2016 02:55 PM   Modules accepted: Orders

## 2016-08-17 NOTE — Patient Instructions (Signed)
Prostatitis Prostatitis is swelling or inflammation of the prostate gland. The prostate is a walnut-sized gland that is involved in the production of semen. It is located below a man's bladder, in front of the rectum. There are four types of prostatitis:  Chronic nonbacterial prostatitis. This is the most common type of prostatitis. It may be associated with a viral infection or autoimmune disorder.  Acute bacterial prostatitis. This is the least common type of prostatitis. It starts quickly and is usually associated with a bladder infection, high fever, and shaking chills. It can occur at any age.  Chronic bacterial prostatitis. This type usually results from acute bacterial prostatitis that happens repeatedly (is recurrent) or has not been treated properly. It can occur in men of any age but is most common among middle-aged men whose prostate has begun to get larger. The symptoms are not as severe as symptoms caused by acute bacterial prostatitis.  Prostatodynia or chronic pelvic pain syndrome (CPPS). This type is also called pelvic floor disorder. It is associated with increased muscular tone in the pelvis surrounding the prostate. What are the causes? Bacterial prostatitis is caused by infection from bacteria. Chronic nonbacterial prostatitis may be caused by:  Urinary tract infections (UTIs).  Nerve damage.  A response by the body's disease-fighting system (autoimmune response).  Chemicals in the urine. The causes of the other types of prostatitis are usually not known. What are the signs or symptoms? Symptoms of this condition vary depending upon the type of prostatitis. If you have acute bacterial prostatitis, you may experience:  Urinary symptoms, such as:  Painful urination.  Burning during urination.  Frequent and sudden urges to urinate.  Inability to start urinating.  A weak or interrupted stream of urine.  Vomiting.  Nausea.  Fever.  Chills.  Inability to  empty the bladder completely.  Pain in the:  Muscles or joints.  Lower back.  Lower abdomen. If you have any of the other types of prostatitis, you may experience:  Urinary symptoms, such as:  Sudden urges to urinate.  Frequent urination.  Difficulty starting urination.  Weak urine stream.  Dribbling after urination.  Discharge from the urethra. The urethra is a tube that opens at the end of the penis.  Pain in the:  Testicles.  Penis or tip of the penis.  Rectum.  Area in front of the rectum and below the scrotum (perineum).  Problems with sexual function.  Painful ejaculation.  Bloody semen. How is this diagnosed? This condition may be diagnosed based on:  A physical and medical exam.  Your symptoms.  A urine test to check for bacteria.  An exam in which a health care provider uses a finger to feel the prostate (digital rectal exam).  A test of a sample of semen.  Blood tests.  Ultrasound.  Removal of prostate tissue to be examined under a microscope (biopsy).  Tests to check how your body handles urine (urodynamic tests).  A test to look inside your bladder or urethra (cystoscopy). How is this treated? Treatment for this condition depends on the type of prostatitis. Treatment may involve:  Medicines to relieve pain or inflammation.  Medicines to help relax your muscles.  Physical therapy.  Heat therapy.  Techniques to help you control certain body functions (biofeedback).  Relaxation exercises.  Antibiotic medicine, if your condition is caused by bacteria.  Warm water baths (sitz baths). Sitz baths help with relaxing your pelvic floor muscles, which helps to relieve pressure on the prostate. Follow   these instructions at home:  Take over-the-counter and prescription medicines only as told by your health care provider.  If you were prescribed an antibiotic, take it as told by your health care provider. Do not stop taking the  antibiotic even if you start to feel better.  If physical therapy, biofeedback, or relaxation exercises were prescribed, do exercises as instructed.  Take sitz baths as directed by your health care provider. For a sitz bath, sit in warm water that is deep enough to cover your hips and buttocks.  Keep all follow-up visits as told by your health care provider. This is important. Contact a health care provider if:  Your symptoms get worse.  You have a fever. Get help right away if:  You have chills.  You feel nauseous.  You vomit.  You feel light-headed or feel like you are going to faint.  You are unable to urinate.  You have blood or blood clots in your urine. This information is not intended to replace advice given to you by your health care provider. Make sure you discuss any questions you have with your health care provider. Document Released: 02/04/2000 Document Revised: 10/28/2015 Document Reviewed: 10/28/2015 Elsevier Interactive Patient Education  2017 Elsevier Inc.  

## 2016-08-17 NOTE — Progress Notes (Signed)
Subjective: Chief Complaint  Patient presents with  . bladder infection    bladder infection- about a month. every time going to it burns     Shawn Norris is a 61 y.o. male who complains of possible urinary tract infection.  He has had symptoms for 1 month.  Symptoms include burning with urination. Patient denies fever, chills, abdominal pain, low back pain, hematuria, N/V/D.  Last UTI was unknown.   Using nothing for current symptoms.    Patient does not have a history of recurrent UTI. Patient does not have a history of pyelonephritis.  No other aggravating or relieving factors.  No other c/o.  Past Medical History:  Diagnosis Date  . Anxiety   . Arthritis   . BPH (benign prostatic hypertrophy)   . Crohn's   . Dysuria   . History of PSVT (paroxysmal supraventricular tachycardia)   . Hypercholesterolemia    May 2013 - TC 152, TG 48, HDL 60, LDL 82  . Hypertension   . Inguinal hernia   . Palpitations   . Prostatitis   . Varicocele     ROS as in subjective  Reviewed allergies, medications, past medical, surgical, and social history.    Objective: Vitals:   08/17/16 1334  BP: 128/80  Pulse: 83  Temp: 98.3 F (36.8 C)    General appearance: alert, no distress, WD/WN, male Abdomen: +bs, soft, non tender, non distended, no masses, no hepatomegaly, no splenomegaly, no bruits Back: no CVA tenderness GU: redness and mild tenderness to tip of penis, no discharge or lesions. Prostate does not appear to be enlarged or boggy. Mild tenderness to palpation and palpation reproduced pain near scrotal area.      Laboratory:  Urine dipstick: negative for all components.   Urine culture sent GC/CT sent.     Assessment: Acute prostatitis without hematuria - Plan: ciprofloxacin (CIPRO) 500 MG tablet, GC/Chlamydia Probe Amp, Urine Culture  Burning with urination - Plan: POCT Urinalysis DIP (Proadvantage Device), GC/Chlamydia Probe Amp, Urine Culture    Plan:  Begin Cipro  as discussed.  Advised increased water intake, can use OTC Tylenol for pain.    Advised to follow up if not back to baseline after completing the antibiotic.    Urine culture sent. GC/CT test sent in urine.  He had negative HIV, RPR in May and symptoms were present then. Did not repeat these today.   Call or return if worse or not improving.

## 2016-08-18 LAB — GC/CHLAMYDIA PROBE AMP
CT Probe RNA: NOT DETECTED
GC Probe RNA: NOT DETECTED

## 2016-08-18 LAB — URINE CULTURE: Organism ID, Bacteria: NO GROWTH

## 2016-09-14 ENCOUNTER — Ambulatory Visit (INDEPENDENT_AMBULATORY_CARE_PROVIDER_SITE_OTHER): Payer: BLUE CROSS/BLUE SHIELD | Admitting: Family Medicine

## 2016-09-14 ENCOUNTER — Encounter: Payer: Self-pay | Admitting: Family Medicine

## 2016-09-14 ENCOUNTER — Other Ambulatory Visit: Payer: Self-pay | Admitting: Family Medicine

## 2016-09-14 ENCOUNTER — Other Ambulatory Visit: Payer: Self-pay

## 2016-09-14 VITALS — BP 110/56 | HR 77

## 2016-09-14 DIAGNOSIS — M7581 Other shoulder lesions, right shoulder: Secondary | ICD-10-CM | POA: Diagnosis not present

## 2016-09-14 DIAGNOSIS — N410 Acute prostatitis: Secondary | ICD-10-CM

## 2016-09-14 MED ORDER — CIPROFLOXACIN HCL 500 MG PO TABS: 500.0000 mg | ORAL_TABLET | Freq: Two times a day (BID) | ORAL | 1 refills | Status: DC

## 2016-09-14 MED ORDER — TRIAMCINOLONE ACETONIDE 40 MG/ML IJ SUSP: 40.0000 mg | Freq: Once | INTRAMUSCULAR | Status: DC

## 2016-09-14 MED ORDER — LIDOCAINE HCL 2 % IJ SOLN: 3.0000 mL | Freq: Once | INTRAMUSCULAR | Status: DC

## 2016-09-14 MED ORDER — LORAZEPAM 0.5 MG PO TABS: 0.5000 mg | ORAL_TABLET | Freq: Four times a day (QID) | ORAL | 0 refills | Status: DC | PRN

## 2016-09-14 NOTE — Telephone Encounter (Signed)
ok 

## 2016-09-14 NOTE — Progress Notes (Signed)
   Subjective:    Patient ID: Shawn Norris, male    DOB: 03-15-1955, 61 y.o.   MRN: [id number]  HPI He is here for 2 complaints. He is again having symptoms of dysuria but no discharge, fever, chills, abdominal pain. He also is having difficulty with right shoulder pain. He was seen recently by Dr. Paulla Fore and treated conservatively with physical therapy. He was supposed to use nitroglycerin but he did not use it. This did help but the symptoms have reoccurred.   Review of Systems     Objective:   Physical Exam Alert and in no distress. Shoulder exam shows full motion of the shoulder. No laxity. Negative sulcus test. No tenderness over bicipital groove or a/c joint. Neer's and Hawkins test was uncomfortable.       Assessment & Plan:  Acute prostatitis without hematuria - Plan: ciprofloxacin (CIPRO) 500 MG tablet  Rotator cuff tendinitis, right - Plan: lidocaine (XYLOCAINE) 2 % (with pres) injection 60 mg, triamcinolone acetonide (KENALOG-40) injection 40 mg Explained that we need to make sure that his urinary symptoms go away completely before stopping as he did mention at the end of the encounter that his symptoms did not go away entirely. I will give him a refill in case he still having some symptoms. I think would be appropriate to give him an injection. The shoulder was prepped with Betadine. 3 mL of Xylocaine and 40 mg of Kenalog was injected in the subacromial bursa without difficulty. Encouraged him to continue with his rehabilitation.

## 2016-09-14 NOTE — Telephone Encounter (Signed)
Is this okay to refill? 

## 2016-09-14 NOTE — Telephone Encounter (Signed)
Called in [place]

## 2016-09-14 NOTE — Telephone Encounter (Signed)
Called in ativan per [employer]

## 2016-11-23 ENCOUNTER — Telehealth: Payer: Self-pay | Admitting: Internal Medicine

## 2016-11-23 ENCOUNTER — Other Ambulatory Visit (INDEPENDENT_AMBULATORY_CARE_PROVIDER_SITE_OTHER): Payer: BLUE CROSS/BLUE SHIELD

## 2016-11-23 DIAGNOSIS — Z23 Encounter for immunization: Secondary | ICD-10-CM

## 2016-11-23 NOTE — Telephone Encounter (Signed)
Pt is coming in tomorrow for shingrix

## 2016-11-23 NOTE — Telephone Encounter (Signed)
Pt was here for his flu shot and asked about the shingrix shot.

## 2016-11-23 NOTE — Telephone Encounter (Signed)
He can come get the Shingrix whenever he wants.

## 2016-11-24 ENCOUNTER — Other Ambulatory Visit: Payer: BLUE CROSS/BLUE SHIELD

## 2016-11-27 ENCOUNTER — Other Ambulatory Visit: Payer: Self-pay | Admitting: Family Medicine

## 2016-11-27 NOTE — Telephone Encounter (Signed)
Called in med to pharmacy  

## 2016-11-27 NOTE — Telephone Encounter (Signed)
Can pt have a refill on this

## 2016-11-27 NOTE — Telephone Encounter (Signed)
ok 

## 2016-12-18 ENCOUNTER — Other Ambulatory Visit: Payer: BLUE CROSS/BLUE SHIELD

## 2016-12-18 DIAGNOSIS — K50919 Crohn's disease, unspecified, with unspecified complications: Secondary | ICD-10-CM

## 2016-12-18 DIAGNOSIS — K5090 Crohn's disease, unspecified, without complications: Secondary | ICD-10-CM | POA: Diagnosis not present

## 2016-12-18 LAB — CBC WITH DIFFERENTIAL/PLATELET
Basophils Absolute: 48 cells/uL (ref 0–200)
Basophils Relative: 1.3 %
Eosinophils Absolute: 59 cells/uL (ref 15–500)
Eosinophils Relative: 1.6 %
HCT: 39.9 % (ref 38.5–50.0)
Hemoglobin: 14.1 g/dL (ref 13.2–17.1)
Lymphs Abs: 840 cells/uL — ABNORMAL LOW (ref 850–3900)
MCH: 31 pg (ref 27.0–33.0)
MCHC: 35.3 g/dL (ref 32.0–36.0)
MCV: 87.7 fL (ref 80.0–100.0)
MPV: 11.3 fL (ref 7.5–12.5)
Monocytes Relative: 9.9 %
Neutro Abs: 2387 cells/uL (ref 1500–7800)
Neutrophils Relative %: 64.5 %
Platelets: 224 10*3/uL (ref 140–400)
RBC: 4.55 10*6/uL (ref 4.20–5.80)
RDW: 12.8 % (ref 11.0–15.0)
Total Lymphocyte: 22.7 %
WBC mixed population: 366 cells/uL (ref 200–950)
WBC: 3.7 10*3/uL — ABNORMAL LOW (ref 3.8–10.8)

## 2017-01-07 ENCOUNTER — Other Ambulatory Visit: Payer: Self-pay | Admitting: Family Medicine

## 2017-01-08 NOTE — Telephone Encounter (Signed)
Please advise on med refill

## 2017-02-14 ENCOUNTER — Other Ambulatory Visit: Payer: Self-pay | Admitting: Family Medicine

## 2017-02-14 NOTE — Telephone Encounter (Signed)
Can pt have a refill on meds  

## 2017-02-19 ENCOUNTER — Telehealth: Payer: Self-pay

## 2017-02-19 NOTE — Telephone Encounter (Signed)
LM that pt due for med check and Shingrix #2. Victor[place] December

## 2017-03-11 ENCOUNTER — Other Ambulatory Visit: Payer: Self-pay | Admitting: Family Medicine

## 2017-03-12 NOTE — Telephone Encounter (Signed)
Can pt have a refill on meds  

## 2017-04-24 ENCOUNTER — Telehealth: Payer: Self-pay | Admitting: Internal Medicine

## 2017-04-24 NOTE — Telephone Encounter (Signed)
Pt called back and has declined shirinrix shot right now

## 2017-04-24 NOTE — Telephone Encounter (Signed)
Left message for pt to call back to schedule both shringrix shots   1- now 2- 2-6 months

## 2017-04-25 ENCOUNTER — Other Ambulatory Visit: Payer: Self-pay | Admitting: Family Medicine

## 2017-04-25 ENCOUNTER — Telehealth: Payer: Self-pay

## 2017-04-25 NOTE — Telephone Encounter (Signed)
Was wondering if this is ok to fill at [facility]. [address] . Please advise. Thank [facility]

## 2017-04-25 NOTE — Telephone Encounter (Signed)
Called pt to let him know that his script for Lorrin Mais  Is up front for pick up. Thanks [employer]

## 2017-04-25 NOTE — Telephone Encounter (Signed)
Ok to give him 10 and then he will need to follow up with Dr. Redmond School

## 2017-05-15 ENCOUNTER — Other Ambulatory Visit: Payer: Self-pay | Admitting: Family Medicine

## 2017-05-15 NOTE — Telephone Encounter (Signed)
Please advise if ambein can be sent in . [facility] [address]. Thanks [employer]

## 2017-06-29 ENCOUNTER — Other Ambulatory Visit: Payer: Self-pay | Admitting: Family Medicine

## 2017-06-29 DIAGNOSIS — E7800 Pure hypercholesterolemia, unspecified: Secondary | ICD-10-CM

## 2017-06-29 NOTE — Telephone Encounter (Signed)
Pt was called to make an appt for a CPE. Pt wants to check with insurance due to a high deductible. Wanted to advise you to see if I can still fill med simvastatin. Please [facility]

## 2017-07-05 ENCOUNTER — Other Ambulatory Visit: Payer: Self-pay | Admitting: Family Medicine

## 2017-07-05 NOTE — Telephone Encounter (Signed)
[facility] is requesting to fill pt ambien. Please advise [facility]

## 2017-07-11 ENCOUNTER — Other Ambulatory Visit: Payer: Self-pay | Admitting: Family Medicine

## 2017-07-11 DIAGNOSIS — I10 Essential (primary) hypertension: Secondary | ICD-10-CM

## 2017-07-23 ENCOUNTER — Telehealth: Payer: Self-pay | Admitting: Family Medicine

## 2017-07-23 DIAGNOSIS — Z0000 Encounter for general adult medical examination without abnormal findings: Secondary | ICD-10-CM

## 2017-07-23 NOTE — Telephone Encounter (Signed)
Pt coming in tomorrow for labs for cpe in august. Please add orders pt would like a PSA added to orders as well.

## 2017-07-24 ENCOUNTER — Other Ambulatory Visit: Payer: BLUE CROSS/BLUE SHIELD

## 2017-07-24 DIAGNOSIS — R7309 Other abnormal glucose: Secondary | ICD-10-CM | POA: Diagnosis not present

## 2017-07-24 DIAGNOSIS — Z0000 Encounter for general adult medical examination without abnormal findings: Secondary | ICD-10-CM | POA: Diagnosis not present

## 2017-07-25 LAB — COMPREHENSIVE METABOLIC PANEL
ALT: 15 IU/L (ref 0–44)
AST: 20 IU/L (ref 0–40)
Albumin/Globulin Ratio: 1.9 (ref 1.2–2.2)
Albumin: 4.4 g/dL (ref 3.6–4.8)
Alkaline Phosphatase: 35 IU/L — ABNORMAL LOW (ref 39–117)
BUN/Creatinine Ratio: 15 (ref 10–24)
BUN: 17 mg/dL (ref 8–27)
Bilirubin Total: 0.5 mg/dL (ref 0.0–1.2)
CO2: 23 mmol/L (ref 20–29)
Calcium: 9.2 mg/dL (ref 8.6–10.2)
Chloride: 102 mmol/L (ref 96–106)
Creatinine, Ser: 1.12 mg/dL (ref 0.76–1.27)
GFR calc Af Amer: 80 mL/min/{1.73_m2} (ref >59)
GFR calc non Af Amer: 69 mL/min/{1.73_m2} (ref >59)
Globulin, Total: 2.3 g/dL (ref 1.5–4.5)
Glucose: 104 mg/dL — ABNORMAL HIGH (ref 65–99)
Potassium: 4.3 mmol/L (ref 3.5–5.2)
Sodium: 142 mmol/L (ref 134–144)
Total Protein: 6.7 g/dL (ref 6.0–8.5)

## 2017-07-25 LAB — CBC WITH DIFFERENTIAL/PLATELET
Basophils Absolute: 0.1 10*3/uL (ref 0.0–0.2)
Basos: 1 %
EOS (ABSOLUTE): 0.1 10*3/uL (ref 0.0–0.4)
Eos: 2 %
Hematocrit: 43.6 % (ref 37.5–51.0)
Hemoglobin: 15 g/dL (ref 13.0–17.7)
Immature Grans (Abs): 0 10*3/uL (ref 0.0–0.1)
Immature Granulocytes: 0 %
Lymphocytes Absolute: 1.4 10*3/uL (ref 0.7–3.1)
Lymphs: 32 %
MCH: 30.5 pg (ref 26.6–33.0)
MCHC: 34.4 g/dL (ref 31.5–35.7)
MCV: 89 fL (ref 79–97)
Monocytes Absolute: 0.3 10*3/uL (ref 0.1–0.9)
Monocytes: 8 %
Neutrophils Absolute: 2.5 10*3/uL (ref 1.4–7.0)
Neutrophils: 57 %
Platelets: 224 10*3/uL (ref 150–450)
RBC: 4.91 x10E6/uL (ref 4.14–5.80)
RDW: 13.6 % (ref 12.3–15.4)
WBC: 4.3 10*3/uL (ref 3.4–10.8)

## 2017-07-25 LAB — LIPID PANEL
Chol/HDL Ratio: 2.6 ratio (ref 0.0–5.0)
Cholesterol, Total: 148 mg/dL (ref 100–199)
HDL: 58 mg/dL (ref >39)
LDL Calculated: 77 mg/dL (ref 0–99)
Triglycerides: 67 mg/dL (ref 0–149)
VLDL Cholesterol Cal: 13 mg/dL (ref 5–40)

## 2017-07-25 LAB — PSA: Prostate Specific Ag, Serum: 0.9 ng/mL (ref 0.0–4.0)

## 2017-07-27 LAB — HGB A1C W/O EAG: Hgb A1c MFr Bld: 5.6 % (ref 4.8–5.6)

## 2017-07-27 LAB — SPECIMEN STATUS REPORT

## 2017-08-20 ENCOUNTER — Other Ambulatory Visit: Payer: Self-pay | Admitting: Family Medicine

## 2017-09-05 ENCOUNTER — Other Ambulatory Visit: Payer: Self-pay | Admitting: Family Medicine

## 2017-09-05 NOTE — Telephone Encounter (Signed)
Is this okay to refill? 

## 2017-09-20 ENCOUNTER — Ambulatory Visit (INDEPENDENT_AMBULATORY_CARE_PROVIDER_SITE_OTHER): Payer: BLUE CROSS/BLUE SHIELD | Admitting: Family Medicine

## 2017-09-20 ENCOUNTER — Encounter: Payer: Self-pay | Admitting: Family Medicine

## 2017-09-20 VITALS — BP 112/74 | HR 79 | Temp 98.5°F | Ht 66.0 in | Wt 148.6 lb

## 2017-09-20 DIAGNOSIS — Z0000 Encounter for general adult medical examination without abnormal findings: Secondary | ICD-10-CM

## 2017-09-20 DIAGNOSIS — E7800 Pure hypercholesterolemia, unspecified: Secondary | ICD-10-CM

## 2017-09-20 DIAGNOSIS — Z8679 Personal history of other diseases of the circulatory system: Secondary | ICD-10-CM

## 2017-09-20 DIAGNOSIS — I10 Essential (primary) hypertension: Secondary | ICD-10-CM

## 2017-09-20 DIAGNOSIS — K50019 Crohn's disease of small intestine with unspecified complications: Secondary | ICD-10-CM

## 2017-09-20 MED ORDER — VERAPAMIL HCL 120 MG PO TABS: 120.0000 mg | ORAL_TABLET | Freq: Two times a day (BID) | ORAL | 3 refills | Status: DC

## 2017-09-20 MED ORDER — LISINOPRIL-HYDROCHLOROTHIAZIDE 10-12.5 MG PO TABS: 1.0000 | ORAL_TABLET | Freq: Every day | ORAL | 3 refills | Status: DC

## 2017-09-20 MED ORDER — SIMVASTATIN 40 MG PO TABS: 40.0000 mg | ORAL_TABLET | Freq: Every day | ORAL | 3 refills | Status: DC

## 2017-09-20 NOTE — Progress Notes (Signed)
   Subjective:    Patient ID: Shawn Norris, male    DOB: 03/08/1955, 62 y.o.   MRN: [id number]  HPI He is here for a complete examination.  He does have hypertension as well as a history of PSVT.  He is on verapamil as well as lisinopril/HCTZ.  His verapamil was becoming quite expensive.  He has Crohn's disease and is followed by Dr. Almyra Free for that.  Also has hyperlipidemia and presently is on medication with having no difficulty.  His allergies cause very little difficulty.  He continues to have difficulty with an underlying anxiety/dysthymia problem.  Presently he is on Paxil but it does interfere with ejaculation.  He is interested in coming off the Paxil.  He is now in a relationship which is going very slowly.  He had a gentleman is in alcohol rehab which does interfere with her ability to socialize.  Family and social history as well as health maintenance and immunizations was reviewed   Review of Systems  All other systems reviewed and are negative.      Objective:   Physical Exam  Alert and in no distress. Tympanic membranes and canals are normal. Pharyngeal area is normal. Neck is supple without adenopathy or thyromegaly. Cardiac exam shows a regular sinus rhythm without murmurs or gallops. Lungs are clear to auscultation.  Abdominal exam shows no masses or tenderness with normal bowel sounds.        Assessment & Plan:  Routine general medical examination at a health care facility  Essential hypertension - Plan: verapamil (CALAN) 120 MG tablet, lisinopril-hydrochlorothiazide (PRINZIDE,ZESTORETIC) 10-12.5 MG tablet  Crohn's disease of small intestine with complication (HCC)  History of PSVT (paroxysmal supraventricular tachycardia) - Plan: verapamil (CALAN) 120 MG tablet  Hypercholesterolemia - Plan: simvastatin (ZOCOR) 40 MG tablet I will switch him to twice daily dosing of verapamil at roughly the same amount.  He will keep track of his heart rate and make sure he stays  under control.  He will continue to be followed by GI for his underlying Crohn's disease.  We will continue him on simvastatin. He would like to come off the Paxil.  I will have him take 10 mg daily for the next month and then stop.  He will keep me informed as to how he is doing psychologically.  Did talk to him concerning his anxiety and the possibility of therapy for this.  Explained the fact that anxiety is anticipation of something that might or may not occur in the future.  He does recognize that this is been how he has handle things his entire life.  I explained that with counseling this can be handled.

## 2017-09-20 NOTE — Patient Instructions (Signed)
Take one half of the Paxil for the next month and then stop

## 2017-09-29 ENCOUNTER — Other Ambulatory Visit: Payer: Self-pay | Admitting: Family Medicine

## 2017-09-29 MED ORDER — VALACYCLOVIR HCL 1 G PO TABS: 1000.0000 mg | ORAL_TABLET | Freq: Three times a day (TID) | ORAL | 0 refills | Status: DC

## 2017-10-03 ENCOUNTER — Other Ambulatory Visit: Payer: Self-pay | Admitting: Family Medicine

## 2017-11-01 ENCOUNTER — Other Ambulatory Visit: Payer: Self-pay | Admitting: Family Medicine

## 2017-11-01 NOTE — Telephone Encounter (Signed)
Is this okay to refill? 

## 2017-11-22 ENCOUNTER — Other Ambulatory Visit: Payer: Self-pay | Admitting: Family Medicine

## 2017-11-22 NOTE — Telephone Encounter (Signed)
[facility] is requesting to fill pt paxil. Please advise [facility]

## 2017-11-24 ENCOUNTER — Other Ambulatory Visit: Payer: Self-pay | Admitting: Family Medicine

## 2017-12-20 DIAGNOSIS — F330 Major depressive disorder, recurrent, mild: Secondary | ICD-10-CM | POA: Diagnosis not present

## 2017-12-21 ENCOUNTER — Other Ambulatory Visit: Payer: Self-pay | Admitting: Family Medicine

## 2017-12-21 NOTE — Telephone Encounter (Signed)
[facility] is requesting to fill pt zoplidem. Please advise [facility]

## 2018-01-02 DIAGNOSIS — F330 Major depressive disorder, recurrent, mild: Secondary | ICD-10-CM | POA: Diagnosis not present

## 2018-01-11 ENCOUNTER — Other Ambulatory Visit: Payer: Self-pay | Admitting: Family Medicine

## 2018-01-11 NOTE — Telephone Encounter (Signed)
Is this ok to refill?  

## 2018-01-18 IMAGING — CR DG SHOULDER 2+V*R*
3 series · 3 of 3 positions shown · non-contrast
Comparison: None.

CLINICAL DATA: Right shoulder pain after fall onto elbow 6 days
ago. Initial encounter.

EXAM:
RIGHT SHOULDER - 2+ VIEW

[w shoulder grashey right]
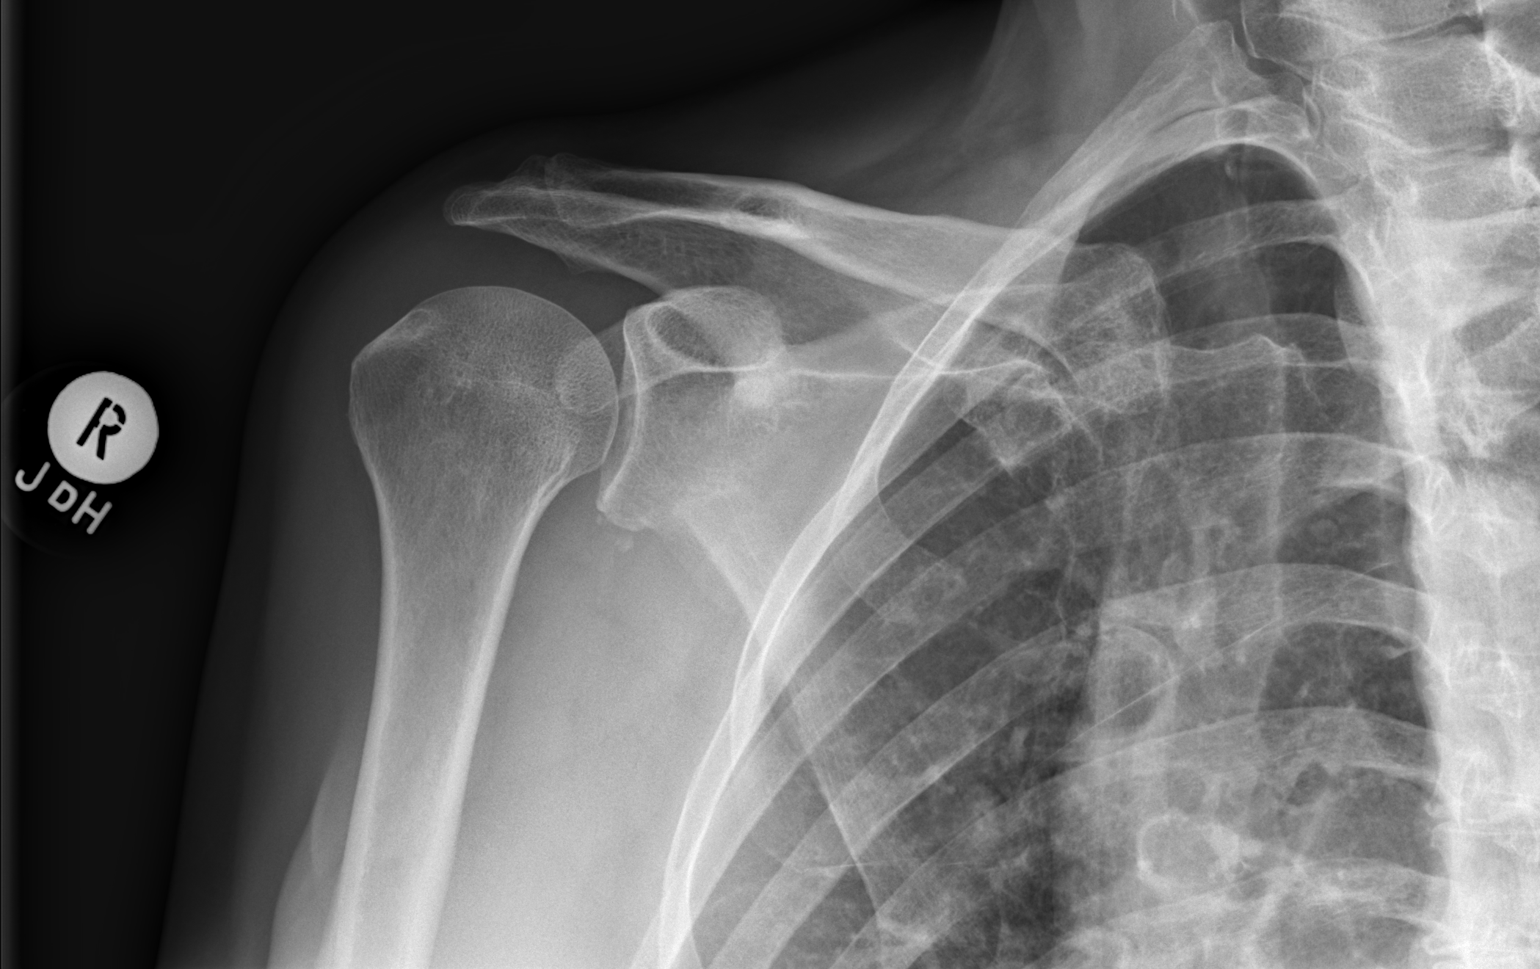

[w shoulder y-view right]
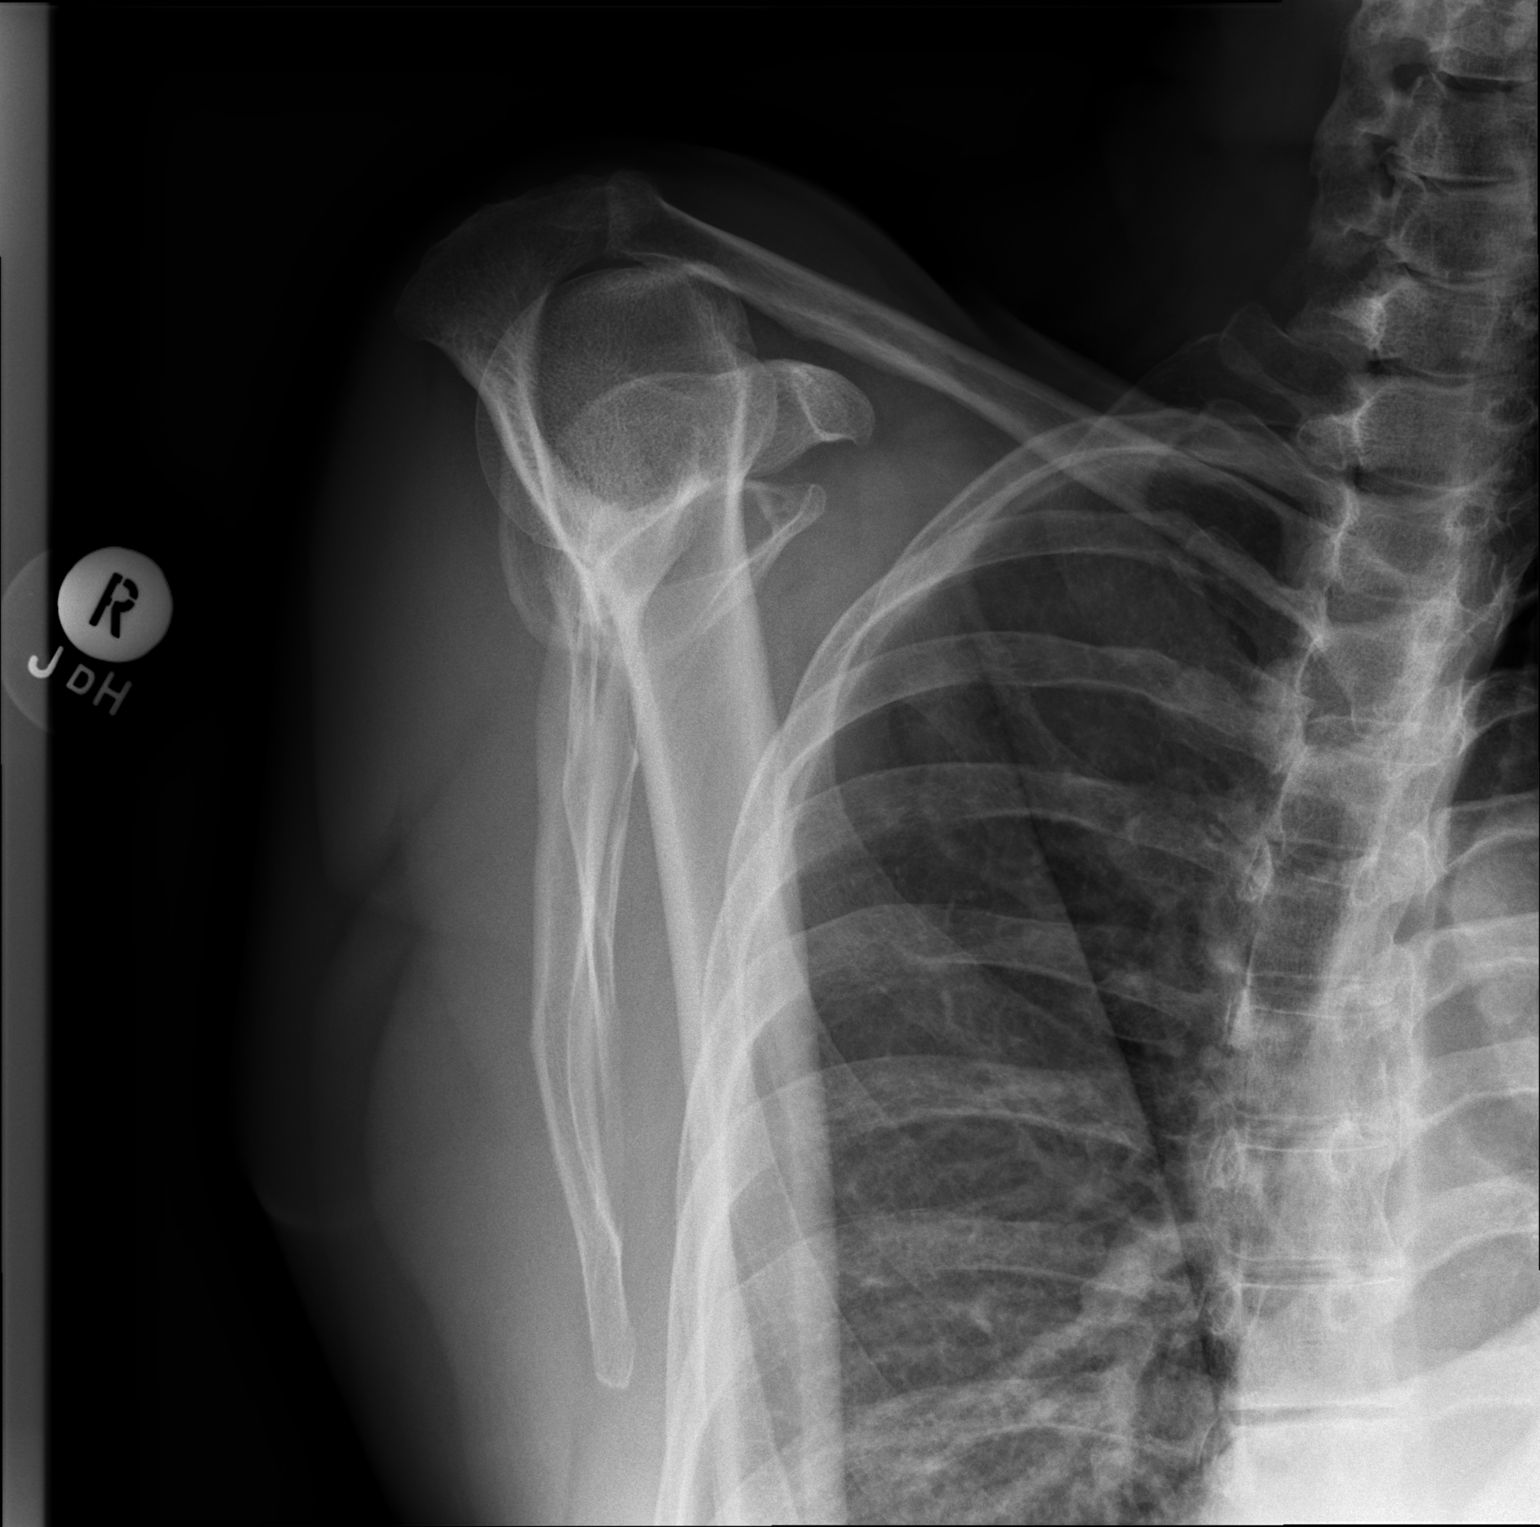

[w shoulder axillary right]
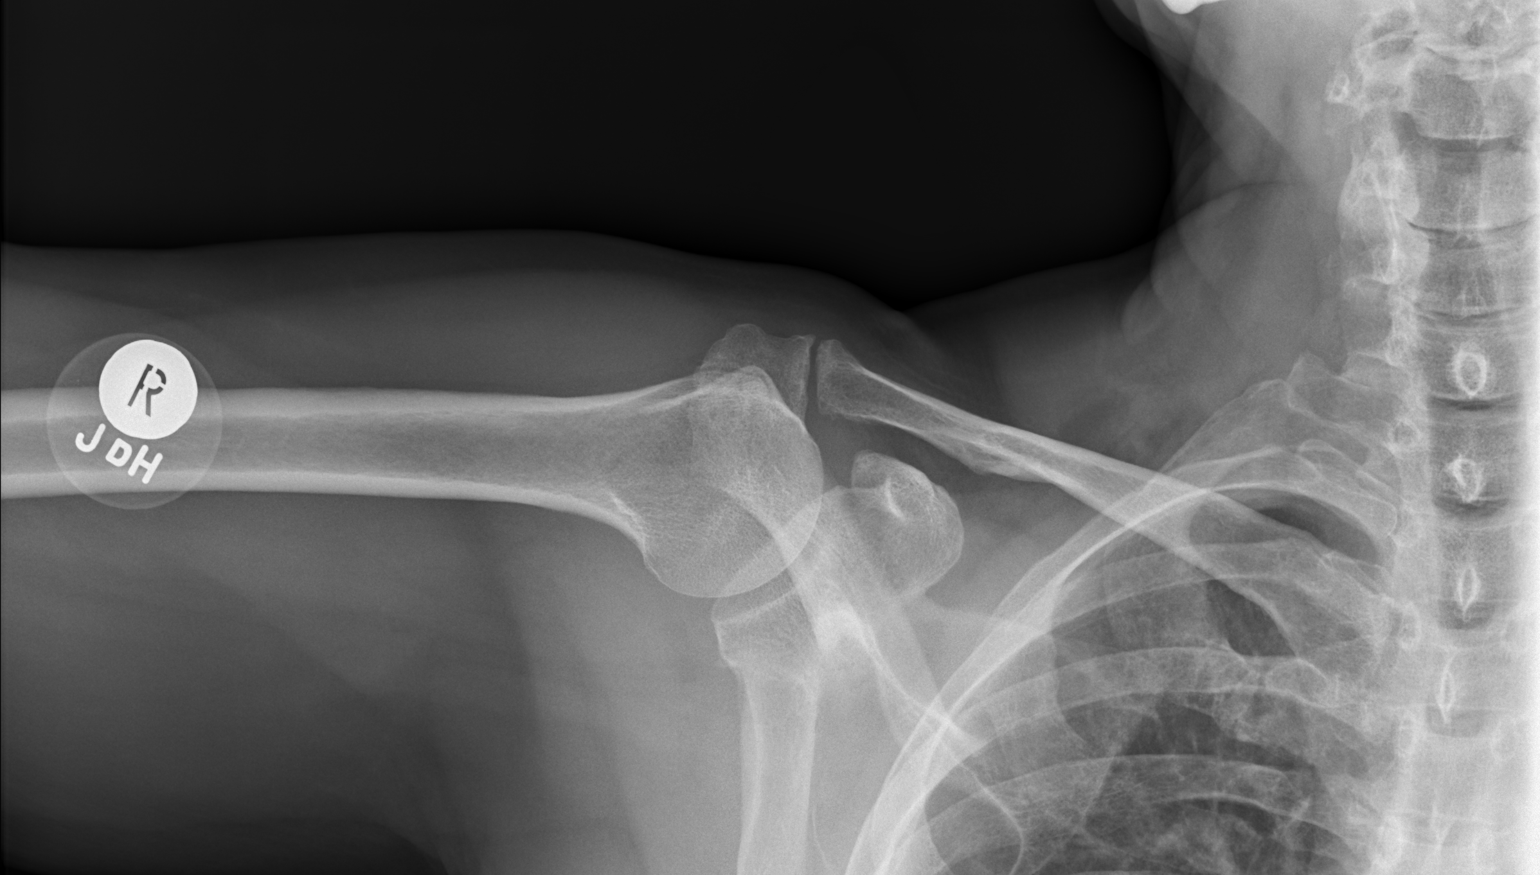

[3 of 3 positions shown; findings below may reference images not displayed]

FINDINGS: Negative for fracture or dislocation. Tiny corticated ossific
structures below the glenoid appear chronic. Mild acromioclavicular
joint spurring. Negative visualized right chest.
IMPRESSION: 1. No acute finding.
2. Small chronic appearing ossicles or intra-articular bodies along
the inferior glenohumeral recess.

## 2018-02-10 ENCOUNTER — Other Ambulatory Visit: Payer: Self-pay | Admitting: Family Medicine

## 2018-02-11 NOTE — Telephone Encounter (Signed)
[facility] is requesting zolpidem. Please advise. [place]

## 2018-02-14 ENCOUNTER — Other Ambulatory Visit: Payer: Self-pay | Admitting: Family Medicine

## 2018-02-14 NOTE — Telephone Encounter (Signed)
[facility] is requesting to fill pt paroxetine. Please advise. [place]

## 2018-03-25 ENCOUNTER — Other Ambulatory Visit: Payer: Self-pay | Admitting: Family Medicine

## 2018-03-25 NOTE — Telephone Encounter (Signed)
[facility] is requesting to fill pt ambien. Please advise [facility]

## 2018-04-24 DIAGNOSIS — K5090 Crohn's disease, unspecified, without complications: Secondary | ICD-10-CM | POA: Diagnosis not present

## 2018-05-15 ENCOUNTER — Other Ambulatory Visit: Payer: Self-pay | Admitting: Family Medicine

## 2018-05-15 NOTE — Telephone Encounter (Signed)
Is this okay to refill? 

## 2018-05-29 ENCOUNTER — Other Ambulatory Visit: Payer: Self-pay | Admitting: Family Medicine

## 2018-05-29 NOTE — Telephone Encounter (Signed)
Is this okay to refill? 

## 2018-07-22 ENCOUNTER — Other Ambulatory Visit: Payer: Self-pay | Admitting: Family Medicine

## 2018-07-22 NOTE — Telephone Encounter (Signed)
Is this okay to refill? 

## 2018-08-05 ENCOUNTER — Other Ambulatory Visit: Payer: Self-pay | Admitting: Family Medicine

## 2018-08-15 ENCOUNTER — Other Ambulatory Visit: Payer: Self-pay

## 2018-08-15 ENCOUNTER — Encounter: Payer: Self-pay | Admitting: Family Medicine

## 2018-08-15 ENCOUNTER — Ambulatory Visit (INDEPENDENT_AMBULATORY_CARE_PROVIDER_SITE_OTHER): Payer: Medicare Other | Admitting: Family Medicine

## 2018-08-15 VITALS — BP 110/68 | HR 83 | Temp 98.9°F | Ht 66.0 in | Wt 138.6 lb

## 2018-08-15 DIAGNOSIS — K50019 Crohn's disease of small intestine with unspecified complications: Secondary | ICD-10-CM

## 2018-08-15 DIAGNOSIS — Z23 Encounter for immunization: Secondary | ICD-10-CM | POA: Diagnosis not present

## 2018-08-15 DIAGNOSIS — I10 Essential (primary) hypertension: Secondary | ICD-10-CM

## 2018-08-15 DIAGNOSIS — E7800 Pure hypercholesterolemia, unspecified: Secondary | ICD-10-CM

## 2018-08-15 DIAGNOSIS — Z8679 Personal history of other diseases of the circulatory system: Secondary | ICD-10-CM

## 2018-08-15 DIAGNOSIS — E785 Hyperlipidemia, unspecified: Secondary | ICD-10-CM

## 2018-08-15 DIAGNOSIS — L570 Actinic keratosis: Secondary | ICD-10-CM

## 2018-08-15 DIAGNOSIS — Z209 Contact with and (suspected) exposure to unspecified communicable disease: Secondary | ICD-10-CM | POA: Diagnosis not present

## 2018-08-15 DIAGNOSIS — J301 Allergic rhinitis due to pollen: Secondary | ICD-10-CM

## 2018-08-15 DIAGNOSIS — Z0000 Encounter for general adult medical examination without abnormal findings: Secondary | ICD-10-CM | POA: Diagnosis not present

## 2018-08-15 DIAGNOSIS — I471 Supraventricular tachycardia: Secondary | ICD-10-CM | POA: Insufficient documentation

## 2018-08-15 DIAGNOSIS — F341 Dysthymic disorder: Secondary | ICD-10-CM | POA: Diagnosis not present

## 2018-08-15 DIAGNOSIS — M6740 Ganglion, unspecified site: Secondary | ICD-10-CM

## 2018-08-15 LAB — LIPID PANEL

## 2018-08-15 MED ORDER — LISINOPRIL-HYDROCHLOROTHIAZIDE 10-12.5 MG PO TABS: 1.0000 | ORAL_TABLET | Freq: Every day | ORAL | 3 refills | Status: DC

## 2018-08-15 MED ORDER — SIMVASTATIN 40 MG PO TABS: 40.0000 mg | ORAL_TABLET | Freq: Every day | ORAL | 3 refills | Status: DC

## 2018-08-15 MED ORDER — VERAPAMIL HCL 120 MG PO TABS: 120.0000 mg | ORAL_TABLET | Freq: Two times a day (BID) | ORAL | 3 refills | Status: DC

## 2018-08-15 NOTE — Progress Notes (Signed)
Shawn Norris is a 63 y.o. male who presents for annual wellness visit and follow-up on chronic medical conditions.  He has the underlying Crohn's disease and is presently not on any medications.  Is having no symptoms from that.  He continues on Paxil but this is causing difficulty with ejaculation.  He is now in a new relationship.  He does use Ativan and Ambien on an as-needed basis and seems to have this under fairly good control.  Presently he is taking verapamil as well as lisinopril/HCTZ.  Continues on simvastatin and is having no difficulty with that.  His allergies seem to be under good control.  He has not had any more difficulty with rapid heart rate.  He has not seen any more skin lesions.  Does wear hat and sunscreen. He also has a lesion present on his right thumb that intermittently causes some concern.  Immunizations and Health Maintenance Immunization History  Administered Date(s) Administered  . Influenza Split 12/19/2010, 11/27/2011  . Influenza,inj,Quad PF,6+ Mos 11/25/2012, 11/21/2013, 12/07/2014, 11/23/2016  . Pneumococcal Conjugate-13 08/15/2018  . Tdap 07/31/2008  . Zoster 12/19/2010   Health Maintenance Due  Topic Date Due  . PNA vac Low Risk Adult (1 of 2 - PCV13) 06/16/2018  . TETANUS/TDAP  08/01/2018   Last colonoscopy:07/30/2014 Last PSA:07/24/2017 Dentist: 03/2018 Ophtho: over two years  Exercise: walking  QD  Other doctors caring for patient include: Dr. Benson [facility],   Advanced Directives: Yes.  Copy requested. Does Patient Have a Medical Advance Directive?: Yes Type of Advance Directive: Healthcare Power of Attorney, Living will Does patient want to make changes to medical advance directive?: No - Patient declined Copy of [place] in Chart?: No - copy requested  Depression screen:  See questionnaire below.     Depression screen [facility] 2/9 08/15/2018 05/20/2012  Decreased Interest 0 0  Down, Depressed, Hopeless 0 0  PHQ - 2 Score 0 0     Fall Screen: See Questionaire below.   Fall Risk  08/15/2018  Falls in the past year? 0    ADL screen:  See questionnaire below.  Functional Status Survey: Is the patient deaf or have difficulty hearing?: No Does the patient have difficulty seeing, even when wearing glasses/contacts?: No Does the patient have difficulty concentrating, remembering, or making decisions?: No Does the patient have difficulty walking or climbing stairs?: No Does the patient have difficulty dressing or bathing?: No Does the patient have difficulty doing errands alone such as visiting a doctor's office or shopping?: No   Review of Systems  Constitutional: -, -unexpected weight change, -anorexia, -fatigue Allergy: -sneezing, -itching, -congestion Dermatology: denies changing moles, rash, lumps ENT: -runny nose, -ear pain, -sore throat,  Cardiology:  -chest pain, -palpitations, -orthopnea, Respiratory: -cough, -shortness of breath, -dyspnea on exertion, -wheezing,  Gastroenterology: -abdominal pain, -nausea, -vomiting, -diarrhea, -constipation, -dysphagia Hematology: -bleeding or bruising problems Musculoskeletal: -arthralgias, -myalgias, -joint swelling, -back pain, - Ophthalmology: -vision changes,  Urology: -dysuria, -difficulty urinating,  -urinary frequency, -urgency, incontinence Neurology: -, -numbness, , -memory loss, -falls, -dizziness    PHYSICAL EXAM:   General Appearance: Alert, cooperative, no distress, appears stated age Head: Normocephalic, without obvious abnormality, atraumatic Eyes: PERRL, conjunctiva/corneas clear, EOM's intact, fundi benign Ears: Normal TM's and external ear canals Nose: Nares normal, mucosa normal, no drainage or sinus   tenderness Throat: Lips, mucosa, and tongue normal; teeth and gums normal Neck: Supple, no lymphadenopathy, thyroid:no enlargement/tenderness/nodules; no carotid bruit or JVD Lungs: Clear to auscultation bilaterally without  wheezes, rales  or ronchi; respirations unlabored Heart: Regular rate and rhythm, S1 and S2 normal, no murmur, rub or gallop Abdomen: Soft, non-tender, nondistended, normoactive bowel sounds, no masses, no hepatosplenomegaly Extremities: No clubbing, cyanosis or edema Pulses: 2+ and symmetric all extremities Skin: Skin color, texture, turgor normal, no rashes or lesions Lymph nodes: Cervical, supraclavicular, and axillary nodes normal Neurologic: CNII-XII intact, normal strength, sensation and gait; reflexes 2+ and symmetric throughout   Psych: Normal mood, affect, hygiene and grooming  ASSESSMENT/PLAN: Hyperlipidemia LDL goal <100 - Plan: Lipid panel,   Essential hypertension - Plan: verapamil (CALAN) 120 MG tablet, lisinopril-hydrochlorothiazide (ZESTORETIC) 10-12.5 MG tablet, CBC with Differential/Platelet, Comprehensive metabolic panel,   History of PSVT (paroxysmal supraventricular tachycardia) - Plan: verapamil (CALAN) 120 MG tablet,   Hypercholesterolemia - Plan: simvastatin (ZOCOR) 40 MG tablet,   Dysthymia - Plan: He is to cut back on his Paxil to 10 mg and then stop.  He will keep me informed as to his mood swings.  Crohn's disease of small intestine with complication ([place]) - Plan: CBC with Differential/Platelet, Comprehensive metabolic panel, follow-up with Dr. Benson Norway as needed  Allergic rhinitis due to pollen, unspecified seasonality - Plan: Continue on present medications  Actinic keratosis of scalp - Plan: Follow-up with dermatology as needed  Contact with or exposure to communicable disease - Plan: HIV Antibody (routine testing w rflx), GC/Chlamydia Probe Amp,   Need for pneumococcal vaccination - Plan: Pneumococcal conjugate vaccine 13-valent,  I explained that this this was of no major concern and recommended leaving it alone.    recommended at least 30 minutes of aerobic activity at least 5 days/week; proper sunscreen use reviewed; healthy diet and alcohol recommendations (less than  or equal to 2 drinks/day)  Immunization recommendations discussed.  Colonoscopy recommendations reviewed.   Medicare Attestation I have personally reviewed: The patient's medical and social history Their use of alcohol, tobacco or illicit drugs Their current medications and supplements The patient's functional ability including ADLs,fall risks, home safety risks, cognitive, and hearing and visual impairment Diet and physical activities Evidence for depression or mood disorders  The patient's weight, height, and BMI have been recorded in the chart.  I have made referrals, counseling, and provided education to the patient based on review of the above and I have provided the patient with a written personalized care plan for preventive services.     Jill Alexanders, MD   08/15/2018

## 2018-08-16 LAB — COMPREHENSIVE METABOLIC PANEL
ALT: 27 IU/L (ref 0–44)
AST: 33 IU/L (ref 0–40)
Albumin/Globulin Ratio: 1.7 (ref 1.2–2.2)
Albumin: 4.3 g/dL (ref 3.8–4.8)
Alkaline Phosphatase: 44 IU/L (ref 39–117)
BUN/Creatinine Ratio: 15 (ref 10–24)
BUN: 16 mg/dL (ref 8–27)
Bilirubin Total: 0.5 mg/dL (ref 0.0–1.2)
CO2: 25 mmol/L (ref 20–29)
Calcium: 9.2 mg/dL (ref 8.6–10.2)
Chloride: 102 mmol/L (ref 96–106)
Creatinine, Ser: 1.06 mg/dL (ref 0.76–1.27)
GFR calc Af Amer: 85 mL/min/{1.73_m2} (ref >59)
GFR calc non Af Amer: 73 mL/min/{1.73_m2} (ref >59)
Globulin, Total: 2.5 g/dL (ref 1.5–4.5)
Glucose: 99 mg/dL (ref 65–99)
Potassium: 4 mmol/L (ref 3.5–5.2)
Sodium: 140 mmol/L (ref 134–144)
Total Protein: 6.8 g/dL (ref 6.0–8.5)

## 2018-08-16 LAB — CBC WITH DIFFERENTIAL/PLATELET
Basophils Absolute: 0 10*3/uL (ref 0.0–0.2)
Basos: 0 %
EOS (ABSOLUTE): 0 10*3/uL (ref 0.0–0.4)
Eos: 0 %
Hematocrit: 42.2 % (ref 37.5–51.0)
Hemoglobin: 14.5 g/dL (ref 13.0–17.7)
Immature Grans (Abs): 0 10*3/uL (ref 0.0–0.1)
Immature Granulocytes: 0 %
Lymphocytes Absolute: 0.8 10*3/uL (ref 0.7–3.1)
Lymphs: 12 %
MCH: 31.1 pg (ref 26.6–33.0)
MCHC: 34.4 g/dL (ref 31.5–35.7)
MCV: 91 fL (ref 79–97)
Monocytes Absolute: 0.6 10*3/uL (ref 0.1–0.9)
Monocytes: 8 %
Neutrophils Absolute: 5.6 10*3/uL (ref 1.4–7.0)
Neutrophils: 80 %
Platelets: 190 10*3/uL (ref 150–450)
RBC: 4.66 x10E6/uL (ref 4.14–5.80)
RDW: 12.7 % (ref 11.6–15.4)
WBC: 7.1 10*3/uL (ref 3.4–10.8)

## 2018-08-16 LAB — GC/CHLAMYDIA PROBE AMP
Chlamydia trachomatis, NAA: NEGATIVE
Neisseria Gonorrhoeae by PCR: NEGATIVE

## 2018-08-16 LAB — LIPID PANEL
Chol/HDL Ratio: 1.8 ratio (ref 0.0–5.0)
Cholesterol, Total: 142 mg/dL (ref 100–199)
HDL: 80 mg/dL (ref >39)
LDL Calculated: 51 mg/dL (ref 0–99)
Triglycerides: 56 mg/dL (ref 0–149)
VLDL Cholesterol Cal: 11 mg/dL (ref 5–40)

## 2018-08-16 LAB — HIV ANTIBODY (ROUTINE TESTING W REFLEX): HIV Screen 4th Generation wRfx: NONREACTIVE

## 2018-09-06 ENCOUNTER — Other Ambulatory Visit: Payer: Self-pay | Admitting: Family Medicine

## 2018-09-06 NOTE — Telephone Encounter (Signed)
Is this okay to refill? 

## 2018-10-08 ENCOUNTER — Other Ambulatory Visit: Payer: Self-pay

## 2018-10-08 ENCOUNTER — Other Ambulatory Visit (INDEPENDENT_AMBULATORY_CARE_PROVIDER_SITE_OTHER): Payer: Medicare Other

## 2018-10-08 DIAGNOSIS — Z23 Encounter for immunization: Secondary | ICD-10-CM | POA: Diagnosis not present

## 2018-10-15 ENCOUNTER — Other Ambulatory Visit: Payer: Self-pay | Admitting: Family Medicine

## 2018-10-15 ENCOUNTER — Other Ambulatory Visit: Payer: Self-pay

## 2018-10-15 DIAGNOSIS — I10 Essential (primary) hypertension: Secondary | ICD-10-CM

## 2018-10-15 MED ORDER — LISINOPRIL-HYDROCHLOROTHIAZIDE 10-12.5 MG PO TABS: 1.0000 | ORAL_TABLET | Freq: Every day | ORAL | 3 refills | Status: DC

## 2018-10-29 ENCOUNTER — Encounter: Payer: Self-pay | Admitting: Family Medicine

## 2018-10-29 ENCOUNTER — Other Ambulatory Visit: Payer: Self-pay | Admitting: Family Medicine

## 2018-10-29 NOTE — Progress Notes (Unsigned)
He did not get Ativan on August 25.  It was an error.  He got 30 pills on September 8.

## 2018-12-06 ENCOUNTER — Other Ambulatory Visit: Payer: Self-pay | Admitting: Family Medicine

## 2018-12-06 NOTE — Telephone Encounter (Signed)
[facility] is requesting to fill pt ambien. Please advise [facility]

## 2019-01-21 ENCOUNTER — Other Ambulatory Visit: Payer: Self-pay | Admitting: Family Medicine

## 2019-01-21 DIAGNOSIS — I10 Essential (primary) hypertension: Secondary | ICD-10-CM

## 2019-01-21 DIAGNOSIS — Z8679 Personal history of other diseases of the circulatory system: Secondary | ICD-10-CM

## 2019-01-27 ENCOUNTER — Other Ambulatory Visit: Payer: Self-pay | Admitting: Family Medicine

## 2019-01-27 NOTE — Telephone Encounter (Signed)
[facility] is requesting to fill pt zolpidem. Please advise [facility]

## 2019-01-28 ENCOUNTER — Ambulatory Visit (INDEPENDENT_AMBULATORY_CARE_PROVIDER_SITE_OTHER): Payer: Medicare Other | Admitting: Family Medicine

## 2019-01-28 ENCOUNTER — Encounter: Payer: Self-pay | Admitting: Family Medicine

## 2019-01-28 ENCOUNTER — Other Ambulatory Visit: Payer: Self-pay

## 2019-01-28 VITALS — Temp 97.0°F | Wt 140.0 lb

## 2019-01-28 DIAGNOSIS — F410 Panic disorder [episodic paroxysmal anxiety] without agoraphobia: Secondary | ICD-10-CM | POA: Diagnosis not present

## 2019-01-28 DIAGNOSIS — G479 Sleep disorder, unspecified: Secondary | ICD-10-CM | POA: Diagnosis not present

## 2019-01-28 DIAGNOSIS — G4700 Insomnia, unspecified: Secondary | ICD-10-CM | POA: Insufficient documentation

## 2019-01-28 NOTE — Progress Notes (Signed)
   Subjective:    Patient ID: Shawn Norris, male    DOB: August 22, 1955, 63 y.o.   MRN: [id number]  HPI Documentation for virtual telephone encounter.  Documentation for virtual audio and video telecommunications through WebEx encounter: The patient was located at home. The provider was located in the office. The patient did consent to this visit and is aware of possible charges through their insurance for this visit. The other persons participating in this telemedicine service were none. This virtual service is not related to other E/M service within previous 7 days. Today's visit is to discuss his medication use.  He was seen in June and the notes from June say that he is using the Ambien on an as-needed basis as well as Ativan.  He does have Ativan and uses it mainly for panic which he rarely has difficulty with.  He did stop taking Paxil as it was causing some erectile dysfunction and continues to be off of this medication.  He is now using Ambien 10 mg on a daily basis and cites work-related stress as well as personal issues.  He wants to continue on this medication daily.  He states that he knows other people that are taking it on a daily basis.    Review of Systems     Objective:   Physical Exam Alert and in no distress otherwise not examined       Assessment & Plan:  Sleep disturbance  Panic attacks He seems to be using the Ativan appropriately.  I then discussed Ambien on explaining that 10 mg dosing is not appropriate and can cause issues with falls.  Discussed the possible use of other medications but again on an as-needed basis.  Discussed sleep hygiene with him and was going to send him information however he says he does not want it.  I discussed possible referral to a sleep specialist as I do not feel comfortable giving him daily Ambien especially at the 10 mg dosing.  I also stated that he could potentially get this medication from another physician and I will be happy  to take care of other needs that he has.  I also explained that he can switch his care entirely to another physician if he chooses.

## 2019-01-29 ENCOUNTER — Other Ambulatory Visit: Payer: Self-pay | Admitting: Family Medicine

## 2019-02-05 ENCOUNTER — Ambulatory Visit (INDEPENDENT_AMBULATORY_CARE_PROVIDER_SITE_OTHER): Payer: Medicare Other | Admitting: Family Medicine

## 2019-02-05 ENCOUNTER — Encounter: Payer: Self-pay | Admitting: Family Medicine

## 2019-02-05 ENCOUNTER — Other Ambulatory Visit: Payer: Self-pay

## 2019-02-05 VITALS — BP 120/70 | HR 64 | Temp 97.9°F | Wt 143.0 lb

## 2019-02-05 DIAGNOSIS — Z20828 Contact with and (suspected) exposure to other viral communicable diseases: Secondary | ICD-10-CM

## 2019-02-05 DIAGNOSIS — Z20822 Contact with and (suspected) exposure to covid-19: Secondary | ICD-10-CM

## 2019-02-05 NOTE — Progress Notes (Signed)
   Subjective:    Patient ID: Shawn Norris, male    DOB: 06-22-1955, 63 y.o.   MRN: [id number]  HPI  He is here for consult concerning possible Covid.  In early November he had difficulty with cough, congestion, slight fever but no smell or taste changes.  The symptoms lasted several days and he is now feeling totally normal.  He is concerned about Covid and would like to be further tested Review of Systems     Objective:   Physical Exam Alert and in no distress.  Cardiac exam shows regular rhythm without murmurs or gallops.  Lungs are clear to auscultation.       Assessment & Plan:  Suspected COVID-19 virus infection - Plan: SAR CoV2 Serology (COVID 19)AB(IGG)IA

## 2019-02-06 LAB — SAR COV2 SEROLOGY (COVID19)AB(IGG),IA: DiaSorin SARS-CoV-2 Ab, IgG: NEGATIVE

## 2019-02-11 ENCOUNTER — Other Ambulatory Visit: Payer: Self-pay | Admitting: Family Medicine

## 2019-02-11 MED ORDER — ZOLPIDEM TARTRATE 5 MG PO TABS: ORAL_TABLET | ORAL | 0 refills | Status: DC

## 2019-03-15 ENCOUNTER — Other Ambulatory Visit: Payer: Self-pay | Admitting: Family Medicine

## 2019-03-17 ENCOUNTER — Telehealth: Payer: Self-pay | Admitting: Family Medicine

## 2019-03-17 NOTE — Telephone Encounter (Signed)
[facility] is requesting to fill pt ambien. Please advise [facility]

## 2019-03-17 NOTE — Telephone Encounter (Signed)
Pt called to inquire as why Ambien was denied. Please advise pt.

## 2019-03-17 NOTE — Telephone Encounter (Signed)
I called him to discuss the continued use of Ambien and my concerns over long-term complications from using this medication.  He states that he has been on this a long time although it looks like lately he has been using it on a daily basis.  Discussed options of using another medication with the idea of eventually getting him off of this medicine.  Also discussed putting him back on Paxil and again with not interested in that.  He was not interested in that.  He chose to seek the care of another physician.

## 2019-04-15 ENCOUNTER — Other Ambulatory Visit: Payer: Self-pay | Admitting: Family Medicine

## 2019-04-15 MED ORDER — LORAZEPAM 0.5 MG PO TABS: 0.5000 mg | ORAL_TABLET | Freq: Four times a day (QID) | ORAL | 0 refills | Status: DC | PRN

## 2019-04-15 NOTE — Telephone Encounter (Signed)
[facility] is requesting to fill pt lorazepam. Please advise [facility]

## 2019-05-14 ENCOUNTER — Encounter: Payer: Self-pay | Admitting: Family Medicine

## 2019-05-14 ENCOUNTER — Ambulatory Visit (INDEPENDENT_AMBULATORY_CARE_PROVIDER_SITE_OTHER): Payer: Medicare Other | Admitting: Family Medicine

## 2019-05-14 ENCOUNTER — Other Ambulatory Visit: Payer: Self-pay

## 2019-05-14 VITALS — BP 132/70 | HR 90 | Temp 97.6°F | Ht 66.0 in | Wt 140.4 lb

## 2019-05-14 DIAGNOSIS — I10 Essential (primary) hypertension: Secondary | ICD-10-CM | POA: Diagnosis not present

## 2019-05-14 DIAGNOSIS — I471 Supraventricular tachycardia: Secondary | ICD-10-CM

## 2019-05-14 DIAGNOSIS — G479 Sleep disorder, unspecified: Secondary | ICD-10-CM | POA: Diagnosis not present

## 2019-05-14 DIAGNOSIS — N400 Enlarged prostate without lower urinary tract symptoms: Secondary | ICD-10-CM | POA: Diagnosis not present

## 2019-05-14 DIAGNOSIS — F410 Panic disorder [episodic paroxysmal anxiety] without agoraphobia: Secondary | ICD-10-CM

## 2019-05-14 DIAGNOSIS — E785 Hyperlipidemia, unspecified: Secondary | ICD-10-CM

## 2019-05-14 DIAGNOSIS — K50019 Crohn's disease of small intestine with unspecified complications: Secondary | ICD-10-CM

## 2019-05-14 LAB — URINALYSIS, ROUTINE W REFLEX MICROSCOPIC
Bilirubin Urine: NEGATIVE
Hgb urine dipstick: NEGATIVE
Ketones, ur: NEGATIVE
Nitrite: NEGATIVE
RBC / HPF: NONE SEEN (ref 0–?)
Specific Gravity, Urine: 1.02 (ref 1.000–1.030)
Total Protein, Urine: NEGATIVE
Urine Glucose: NEGATIVE
Urobilinogen, UA: 0.2 (ref 0.0–1.0)
pH: 6 (ref 5.0–8.0)

## 2019-05-14 MED ORDER — ZOLPIDEM TARTRATE 5 MG PO TABS: ORAL_TABLET | ORAL | 0 refills | Status: DC

## 2019-05-14 NOTE — Assessment & Plan Note (Signed)
Database without red flags.  Continue Ativan as needed.  Does not need refill today.

## 2019-05-14 NOTE — Assessment & Plan Note (Signed)
Frequent urination and nocturia likely secondary to BPH.  Will check UA today.  Discussed starting medication however patient inclined.  Discussed behavioral modifications.

## 2019-05-14 NOTE — Assessment & Plan Note (Signed)
Regular rate and rhythm today.  Continue verapamil 120 mg twice daily.

## 2019-05-14 NOTE — Assessment & Plan Note (Signed)
Continue simvastatin 40 mg daily.  Check lipid panel next blood draw.

## 2019-05-14 NOTE — Assessment & Plan Note (Signed)
At goal.  Continue lisinopril-HCTZ 10-12.5 once daily and verapamil 135m twice daily.

## 2019-05-14 NOTE — Assessment & Plan Note (Signed)
We will give 1 month refill on Ambien today.  He will check with his insurance to see if they are ready other alternatives such as Lunesta.

## 2019-05-14 NOTE — Assessment & Plan Note (Signed)
Stable.  No recent flares.  Continue azathioprine as needed.

## 2019-05-14 NOTE — Progress Notes (Signed)
Shawn Norris is a 64 y.o. male who presents today for an office visit.  Assessment/Plan:  Chronic Problems Addressed Today: Crohn's disease Stable.  No recent flares.  Continue azathioprine as needed.  Hyperlipidemia LDL goal <100 Continue simvastatin 40 mg daily.  Check lipid panel next blood draw.  Hypertension At goal.  Continue lisinopril-HCTZ 10-12.5 once daily and verapamil 164m twice daily.  BPH (benign prostatic hyperplasia) Frequent urination and nocturia likely secondary to BPH.  Will check UA today.  Discussed starting medication however patient inclined.  Discussed behavioral modifications.  Sleep disturbance We will give 1 month refill on Ambien today.  He will check with his insurance to see if they are ready other alternatives such as Lunesta.  Panic attacks Database without red flags.  Continue Ativan as needed.  Does not need refill today.  PSVT (paroxysmal supraventricular tachycardia) (HCC) Regular rate and rhythm today.  Continue verapamil 120 mg twice daily.  He will follow-up in about 3 months for CPE with blood work.    Subjective:  HPI:  His stable, chronic medical conditions are outlined below:  # Essential Hypertension / History of SVT - On lisinopril-HCTZ 10-12.553mdaily and verapamil 120 mg twice daily.  Tolerating both well.  # Anxiety / Panic Disorder / Insomnia - On ativan 0.74m56ms needed for panic attacks - On ambien 74mg80mghtly as needed and tolerating well - ROS: No reported SI or HI.  # Dyslipidemia - On simvastatin 40mg574mly and tolerating well  # Crohn's Dsiease - Not currently on any medications - Uses azathioprine as needed for flare ups  PMH:  The following were reviewed and entered/updated in epic: Past Medical History:  Diagnosis Date  . Allergy   . Anxiety   . Arthritis   . BPH (benign prostatic hypertrophy)   . Crohn's   . Dysuria   . History of PSVT (paroxysmal supraventricular tachycardia)   .  Hypercholesterolemia    May 2013 - TC 152, TG 48, HDL 60, LDL 82  . Hypertension   . Inguinal hernia   . Insomnia   . Palpitations   . Prostatitis   . Varicocele    Patient Active Problem List   Diagnosis Date Noted  . BPH (benign prostatic hyperplasia) 05/14/2019  . Panic attacks 01/28/2019  . Sleep disturbance 01/28/2019  . PSVT (paroxysmal supraventricular tachycardia) (HCC) [place]25/2020  . Allergic rhinitis 06/17/2013  . Dysthymia 06/17/2013  . Actinic keratosis of scalp 01/08/2013  . Hypertension 07/03/2011  . Hyperlipidemia LDL goal <100 07/03/2011  . Crohn's disease (HCC) [place]13/2013   Past Surgical History:  Procedure Laterality Date  . ACHILLES TENDON REPAIR    . INGUINAL HERNIA REPAIR      Family History  Problem Relation Age of Onset  . Dementia Mother     Medications- reviewed and updated Current Outpatient Medications  Medication Sig Dispense Refill  . aspirin EC 81 MG tablet Take 81 mg by mouth daily.    . azaMarland KitchenHIOprine (IMURAN) 50 MG tablet TAKE 1 TABLET BY MOUTH TWICE DAILY 60 tablet 1  . fluticasone (FLONASE) 50 MCG/ACT nasal spray SHAKE LIQUID AND USE 2 SPRAYS IN EACH NOSTRIL EVERY DAY 48 g 1  . lisinopril-hydrochlorothiazide (ZESTORETIC) 10-12.5 MG tablet Take 1 tablet by mouth daily. 90 tablet 3  . LORazepam (ATIVAN) 0.5 MG tablet Take 1 tablet (0.5 mg total) by mouth every 6 (six) hours as needed. for anxiety 30 tablet 0  . Olopatadine HCl 0.6 % SOLN Place into  the nose.    . simvastatin (ZOCOR) 40 MG tablet Take 1 tablet (40 mg total) by mouth at bedtime. 90 tablet 3  . verapamil (CALAN) 120 MG tablet TAKE 1 TABLET(120 MG) BY MOUTH TWICE DAILY 180 tablet 3  . zolpidem (AMBIEN) 5 MG tablet TAKE 1 TABLET(5 MG) BY MOUTH AT BEDTIME AS NEEDED FOR SLEEP 30 tablet 0   No current facility-administered medications for this visit.    Allergies-reviewed and updated Allergies  Allergen Reactions  . Penicillins Rash    Social History   Socioeconomic  History  . Marital status: Single    Spouse name: Not on file  . Number of children: Not on file  . Years of education: Not on file  . Highest education level: Not on file  Occupational History  . Not on file  Tobacco Use  . Smoking status: Never Smoker  . Smokeless tobacco: Never Used  Substance and Sexual Activity  . Alcohol use: Yes    Alcohol/week: 4.0 standard drinks    Types: 4 Glasses of wine per week  . Drug use: No  . Sexual activity: Yes  Other Topics Concern  . Not on file  Social History Narrative    He is a single healthy gentleman. He is an avid exerciser at the gym with treadmill, and other exercises routinely. He drinks social alcohol but otherwise is stable.    Social Determinants of Health   Financial Resource Strain:   . Difficulty of Paying Living Expenses:   Food Insecurity:   . Worried About Charity fundraiser in the Last Year:   . Arboriculturist in the Last Year:   Transportation Needs:   . Film/video editor (Medical):   Marland Kitchen Lack of Transportation (Non-Medical):   Physical Activity:   . Days of Exercise per Week:   . Minutes of Exercise per Session:   Stress:   . Feeling of Stress :   Social Connections:   . Frequency of Communication with Friends and Family:   . Frequency of Social Gatherings with Friends and Family:   . Attends Religious Services:   . Active Member of Clubs or Organizations:   . Attends Archivist Meetings:   Marland Kitchen Marital Status:         Objective:  Physical Exam: BP 132/70   Pulse 90   Temp 97.6 F (36.4 C)   Ht 5' 6"  (1.676 Norris)   Wt 140 lb 6.1 oz (63.7 kg)   SpO2 98%   BMI 22.66 kg/Norris   Gen: No acute distress, resting comfortably CV: Regular rate and rhythm with no murmurs appreciated Pulm: Normal work of breathing, clear to auscultation bilaterally with no crackles, wheezes, or rhonchi Neuro: Grossly normal, moves all extremities Psych: Normal affect and thought content      Shawn Norris. Shawn Pain,  MD 05/14/2019 10:10 AM

## 2019-05-14 NOTE — Patient Instructions (Signed)
It was very nice to see you today!  We will refill your Ambien today.  Please call your insurance to see if they will pay for Lunesta or any other alternative medication.  We will check a urine sample today.  Come back in 3 months for your annual physical with blood work, or sooner if needed.  Take care, Dr [name]  Please try these tips to maintain a healthy lifestyle:   Eat at least 3 REAL meals and 1-2 snacks per day.  Aim for no more than 5 hours between eating.  If you eat breakfast, please do so within one hour of getting up.    Each meal should contain half fruits/vegetables, one quarter protein, and one quarter carbs (no bigger than a computer mouse)   Cut down on sweet beverages. This includes juice, soda, and sweet tea.    Drink at least 1 glass of water with each meal and aim for at least 8 glasses per day   Exercise at least 150 minutes every week.

## 2019-05-15 ENCOUNTER — Other Ambulatory Visit: Payer: Self-pay | Admitting: *Deleted

## 2019-05-15 DIAGNOSIS — N400 Enlarged prostate without lower urinary tract symptoms: Secondary | ICD-10-CM

## 2019-05-15 NOTE — Progress Notes (Signed)
Please inform patient of the following:  Urine specimen with mild inflammation. Would like to make sure he doesn't have a UTI. Please ask pt come back to give urine sample and place order for urine culture.  Algis Greenhouse. [name], MD 05/15/2019 2:22 PM

## 2019-06-10 ENCOUNTER — Other Ambulatory Visit: Payer: Self-pay | Admitting: Family Medicine

## 2019-06-10 ENCOUNTER — Telehealth: Payer: Self-pay | Admitting: Family Medicine

## 2019-06-10 MED ORDER — LORAZEPAM 0.5 MG PO TABS: 0.5000 mg | ORAL_TABLET | Freq: Four times a day (QID) | ORAL | 5 refills | Status: DC | PRN

## 2019-06-10 NOTE — Telephone Encounter (Signed)
Pt requesting refill on Ambien. Last OV 04/2019.

## 2019-06-10 NOTE — Telephone Encounter (Signed)
Pt requesting refills  Rx Lorazepam 0.5 MG Last OV 03/224/2021 Last refill 04/15/2019 #[phone] Rf

## 2019-06-10 NOTE — Telephone Encounter (Signed)
MEDICATION: Lorazepam 0.5 MG  PHARMACY: [facility] [address] [facility]  Comments:   **Let patient know to contact pharmacy at the end of the day to make sure medication is ready. **  ** Please notify patient to allow 48-72 hours to process**  **Encourage patient to contact the pharmacy for refills or they can request refills through [facility]**

## 2019-06-12 ENCOUNTER — Telehealth: Payer: Self-pay | Admitting: Family Medicine

## 2019-06-12 NOTE — Telephone Encounter (Signed)
Information given to pt  Information was needed for clinical studies

## 2019-06-12 NOTE — Telephone Encounter (Signed)
Pt called asking if medical assistant could call and tell him his most up-to-date LDL levels. Please advise.

## 2019-08-07 ENCOUNTER — Telehealth: Payer: Self-pay | Admitting: Family Medicine

## 2019-08-07 NOTE — Telephone Encounter (Signed)
MEDICATION: Ambien 5 MG  PHARMACY: [facility] [address]   Comments: PT states he will be going out of town on Sunday for a week. Pt states his Ambien will run out on the 23rd and will need a refill. Pt asked if Dr. [name] could send in a few pills that will get him through the week until he gets home to pick up his new prescription. Please advise.   **Let patient know to contact pharmacy at the end of the day to make sure medication is ready. **  ** Please notify patient to allow 48-72 hours to process**  **Encourage patient to contact the pharmacy for refills or they can request refills through [facility]**

## 2019-08-08 NOTE — Telephone Encounter (Signed)
Rx request 

## 2019-08-11 NOTE — Telephone Encounter (Signed)
I am just now seeing this - can you check and see if there is a local pharmacy we can send to?  Algis Greenhouse. [name], MD 08/11/2019 3:49 PM

## 2019-08-12 ENCOUNTER — Other Ambulatory Visit: Payer: Self-pay | Admitting: *Deleted

## 2019-08-12 MED ORDER — ZOLPIDEM TARTRATE 5 MG PO TABS: ORAL_TABLET | ORAL | 0 refills | Status: DC

## 2019-08-12 NOTE — Telephone Encounter (Signed)
Rx send to [place], per Pt Pt aware

## 2019-08-15 ENCOUNTER — Ambulatory Visit: Payer: Self-pay | Admitting: Family Medicine

## 2019-08-18 ENCOUNTER — Encounter: Payer: Self-pay | Admitting: Family Medicine

## 2019-08-18 ENCOUNTER — Ambulatory Visit (INDEPENDENT_AMBULATORY_CARE_PROVIDER_SITE_OTHER): Payer: Medicare Other | Admitting: Family Medicine

## 2019-08-18 ENCOUNTER — Other Ambulatory Visit: Payer: Self-pay

## 2019-08-18 VITALS — BP 110/62 | HR 73 | Temp 97.0°F | Ht 66.0 in | Wt 138.0 lb

## 2019-08-18 DIAGNOSIS — I10 Essential (primary) hypertension: Secondary | ICD-10-CM | POA: Diagnosis not present

## 2019-08-18 DIAGNOSIS — Z125 Encounter for screening for malignant neoplasm of prostate: Secondary | ICD-10-CM

## 2019-08-18 DIAGNOSIS — R739 Hyperglycemia, unspecified: Secondary | ICD-10-CM

## 2019-08-18 DIAGNOSIS — N400 Enlarged prostate without lower urinary tract symptoms: Secondary | ICD-10-CM | POA: Diagnosis not present

## 2019-08-18 DIAGNOSIS — L570 Actinic keratosis: Secondary | ICD-10-CM | POA: Insufficient documentation

## 2019-08-18 DIAGNOSIS — F341 Dysthymic disorder: Secondary | ICD-10-CM | POA: Diagnosis not present

## 2019-08-18 DIAGNOSIS — G479 Sleep disorder, unspecified: Secondary | ICD-10-CM

## 2019-08-18 DIAGNOSIS — F410 Panic disorder [episodic paroxysmal anxiety] without agoraphobia: Secondary | ICD-10-CM | POA: Diagnosis not present

## 2019-08-18 DIAGNOSIS — E7800 Pure hypercholesterolemia, unspecified: Secondary | ICD-10-CM

## 2019-08-18 DIAGNOSIS — K50019 Crohn's disease of small intestine with unspecified complications: Secondary | ICD-10-CM

## 2019-08-18 DIAGNOSIS — E785 Hyperlipidemia, unspecified: Secondary | ICD-10-CM

## 2019-08-18 DIAGNOSIS — Z0001 Encounter for general adult medical examination with abnormal findings: Secondary | ICD-10-CM

## 2019-08-18 NOTE — Assessment & Plan Note (Signed)
At goal.  Continue lisinopril-HCTZ 10-12.5 once daily.  Continue verapamil 120 mg daily.  Check CBC, CMP, TSH.

## 2019-08-18 NOTE — Assessment & Plan Note (Signed)
Cryotherapy applied today.  See below procedure note.

## 2019-08-18 NOTE — Patient Instructions (Signed)
It was very nice to see you today!  We will check blood work and a urine sample today.  No medication changes.  Come back in 6 months or sooner if needed.   Take care, Dr [name]  Please try these tips to maintain a healthy lifestyle:   Eat at least 3 REAL meals and 1-2 snacks per day.  Aim for no more than 5 hours between eating.  If you eat breakfast, please do so within one hour of getting up.    Each meal should contain half fruits/vegetables, one quarter protein, and one quarter carbs (no bigger than a computer mouse)   Cut down on sweet beverages. This includes juice, soda, and sweet tea.     Drink at least 1 glass of water with each meal and aim for at least 8 glasses per day   Exercise at least 150 minutes every week.    Preventive Care 29 Years and Older, Male Preventive care refers to lifestyle choices and visits with your health care provider that can promote health and wellness. This includes:  A yearly physical exam. This is also called an annual well check.  Regular dental and eye exams.  Immunizations.  Screening for certain conditions.  Healthy lifestyle choices, such as diet and exercise. What can I expect for my preventive care visit? Physical exam Your health care provider will check:  Height and weight. These may be used to calculate body mass index (BMI), which is a measurement that tells if you are at a healthy weight.  Heart rate and blood pressure.  Your skin for abnormal spots. Counseling Your health care provider may ask you questions about:  Alcohol, tobacco, and drug use.  Emotional well-being.  Home and relationship well-being.  Sexual activity.  Eating habits.  History of falls.  Memory and ability to understand (cognition).  Work and work Statistician. What immunizations do I need?  Influenza (flu) vaccine  This is recommended every year. Tetanus, diphtheria, and pertussis (Tdap) vaccine  You may need a Td  booster every 10 years. Varicella (chickenpox) vaccine  You may need this vaccine if you have not already been vaccinated. Zoster (shingles) vaccine  You may need this after age 75. Pneumococcal conjugate (PCV13) vaccine  One dose is recommended after age 50. Pneumococcal polysaccharide (PPSV23) vaccine  One dose is recommended after age 73. Measles, mumps, and rubella (MMR) vaccine  You may need at least one dose of MMR if you were born in 1957 or later. You may also need a second dose. Meningococcal conjugate (MenACWY) vaccine  You may need this if you have certain conditions. Hepatitis A vaccine  You may need this if you have certain conditions or if you travel or work in places where you may be exposed to hepatitis A. Hepatitis B vaccine  You may need this if you have certain conditions or if you travel or work in places where you may be exposed to hepatitis B. Haemophilus influenzae type b (Hib) vaccine  You may need this if you have certain conditions. You may receive vaccines as individual doses or as more than one vaccine together in one shot (combination vaccines). Talk with your health care provider about the risks and benefits of combination vaccines. What tests do I need? Blood tests  Lipid and cholesterol levels. These may be checked every 5 years, or more frequently depending on your overall health.  Hepatitis C test.  Hepatitis B test. Screening  Lung cancer screening. You may have  this screening every year starting at age 86 if you have a 30-pack-year history of smoking and currently smoke or have quit within the past 15 years.  Colorectal cancer screening. All adults should have this screening starting at age 88 and continuing until age 18. Your health care provider may recommend screening at age 25 if you are at increased risk. You will have tests every 1-10 years, depending on your results and the type of screening test.  Prostate cancer screening.  Recommendations will vary depending on your family history and other risks.  Diabetes screening. This is done by checking your blood sugar (glucose) after you have not eaten for a while (fasting). You may have this done every 1-3 years.  Abdominal aortic aneurysm (AAA) screening. You may need this if you are a current or former smoker.  Sexually transmitted disease (STD) testing. Follow these instructions at home: Eating and drinking  Eat a diet that includes fresh fruits and vegetables, whole grains, lean protein, and low-fat dairy products. Limit your intake of foods with high amounts of sugar, saturated fats, and salt.  Take vitamin and mineral supplements as recommended by your health care provider.  Do not drink alcohol if your health care provider tells you not to drink.  If you drink alcohol: ? Limit how much you have to 0-2 drinks a day. ? Be aware of how much alcohol is in your drink. In the [place] one drink equals one 12 oz bottle of beer (355 mL), one 5 oz glass of wine (148 mL), or one 1 oz glass of hard liquor (44 mL). Lifestyle  Take daily care of your teeth and gums.  Stay active. Exercise for at least 30 minutes on 5 or more days each week.  Do not use any products that contain nicotine or tobacco, such as cigarettes, e-cigarettes, and chewing tobacco. If you need help quitting, ask your health care provider.  If you are sexually active, practice safe sex. Use a condom or other form of protection to prevent STIs (sexually transmitted infections).  Talk with your health care provider about taking a low-dose aspirin or statin. What's next?  Visit your health care provider once a year for a well check visit.  Ask your health care provider how often you should have your eyes and teeth checked.  Stay up to date on all vaccines. This information is not intended to replace advice given to you by your health care provider. Make sure you discuss any questions you have with  your health care provider. Document Revised: 01/31/2018 Document Reviewed: 01/31/2018 Elsevier Patient Education  2020 [employer].

## 2019-08-18 NOTE — Progress Notes (Signed)
Chief Complaint:  Shawn Norris is a 64 y.o. male who presents today for his annual comprehensive physical exam.    Assessment/Plan:  Chronic Problems Addressed Today: Dysthymia Stable.  Does not want to do daily medication at this point.  Crohn's disease Stable.  No recent flares.  Continue azathioprine as needed.  Hyperlipidemia LDL goal <100 Continue simvastatin 40 mg daily.  Will check lipid panel today.  Hypertension At goal.  Continue lisinopril-HCTZ 10-12.5 once daily.  Continue verapamil 120 mg daily.  Check CBC, CMP, TSH.  Actinic keratosis Cryotherapy applied today.  See below procedure note.  BPH (benign prostatic hyperplasia) Stable without medications.  Will check PSA, urine culture, and UA.  Sleep disturbance Stable.  Continue Ambien 5 mg nightly as needed.  Panic attacks Had a panic attack within the last week.  Resolved with Ativan.  Has not had any other issues for quite a while.  Does not want to start any other medications at this point.  Discussed mindfulness and breathing exercises.  Follow-up in 6 months.  Preventative Healthcare: Colonoscopy 5 years ago-per patient does not need repeat for another 5 years.  Will check CBC, C met, TSH, lipid panel.  Patient Counseling(The following topics were reviewed and/or handout was given):  -Nutrition: Stressed importance of moderation in sodium/caffeine intake, saturated fat and cholesterol, caloric balance, sufficient intake of fresh fruits, vegetables, and fiber.  -Stressed the importance of regular exercise.   -Substance Abuse: Discussed cessation/primary prevention of tobacco, alcohol, or other drug use; driving or other dangerous activities under the influence; availability of treatment for abuse.   -Injury prevention: Discussed safety belts, safety helmets, smoke detector, smoking near bedding or upholstery.   -Sexuality: Discussed sexually transmitted diseases, partner selection, use of condoms,  avoidance of unintended pregnancy and contraceptive alternatives.   -Dental health: Discussed importance of regular tooth brushing, flossing, and dental visits.  -Health maintenance and immunizations reviewed. Please refer to Health maintenance section.  Return to care in 1 year for next preventative visit.     Subjective:  HPI:  He has no acute complaints today.   Lifestyle Diet: Balanced. Plenty of fruits and vegetables.  Exercise: Trying to get back into going into the gym.   Depression screen PHQ 2/9 08/15/2018  Decreased Interest 0  Down, Depressed, Hopeless 0  PHQ - 2 Score 0    Health Maintenance Due  Topic Date Due  . COVID-19 Vaccine (1) Never done  . PNA vac Low Risk Adult (2 of 2 - PPSV23) 08/15/2019     ROS: Per HPI, otherwise a complete review of systems was negative.   PMH:  The following were reviewed and entered/updated in epic: Past Medical History:  Diagnosis Date  . Allergy   . Anxiety   . Arthritis   . BPH (benign prostatic hypertrophy)   . Crohn's   . Dysuria   . History of PSVT (paroxysmal supraventricular tachycardia)   . Hypercholesterolemia    May 2013 - TC 152, TG 48, HDL 60, LDL 82  . Hypertension   . Inguinal hernia   . Insomnia   . Palpitations   . Prostatitis   . Varicocele    Patient Active Problem List   Diagnosis Date Noted  . Actinic keratosis 08/18/2019  . BPH (benign prostatic hyperplasia) 05/14/2019  . Panic attacks 01/28/2019  . Sleep disturbance 01/28/2019  . PSVT (paroxysmal supraventricular tachycardia) ([place]) 08/15/2018  . Allergic rhinitis 06/17/2013  . Dysthymia 06/17/2013  . Actinic keratosis of  scalp 01/08/2013  . Hypertension 07/03/2011  . Hyperlipidemia LDL goal <100 07/03/2011  . Crohn's disease ([place]) 07/03/2011   Past Surgical History:  Procedure Laterality Date  . ACHILLES TENDON REPAIR    . INGUINAL HERNIA REPAIR      Family History  Problem Relation Age of Onset  . Dementia Mother      Medications- reviewed and updated Current Outpatient Medications  Medication Sig Dispense Refill  . aspirin EC 81 MG tablet Take 81 mg by mouth daily.    . fluticasone (FLONASE) 50 MCG/ACT nasal spray SHAKE LIQUID AND USE 2 SPRAYS IN EACH NOSTRIL EVERY DAY 48 g 1  . lisinopril-hydrochlorothiazide (ZESTORETIC) 10-12.5 MG tablet Take 1 tablet by mouth daily. 90 tablet 3  . LORazepam (ATIVAN) 0.5 MG tablet Take 1 tablet (0.5 mg total) by mouth every 6 (six) hours as needed. for anxiety 30 tablet 5  . Olopatadine HCl 0.6 % SOLN Place into the nose.    . simvastatin (ZOCOR) 40 MG tablet Take 1 tablet (40 mg total) by mouth at bedtime. 90 tablet 3  . verapamil (CALAN) 120 MG tablet TAKE 1 TABLET(120 MG) BY MOUTH TWICE DAILY 180 tablet 3  . zolpidem (AMBIEN) 5 MG tablet TAKE 1 TABLET(5 MG) BY MOUTH AT BEDTIME AS NEEDED FOR SLEEP 30 tablet 0   No current facility-administered medications for this visit.    Allergies-reviewed and updated Allergies  Allergen Reactions  . Penicillins Rash    Social History   Socioeconomic History  . Marital status: Single    Spouse name: Not on file  . Number of children: Not on file  . Years of education: Not on file  . Highest education level: Not on file  Occupational History  . Not on file  Tobacco Use  . Smoking status: Never Smoker  . Smokeless tobacco: Never Used  Substance and Sexual Activity  . Alcohol use: Yes    Alcohol/week: 4.0 standard drinks    Types: 4 Glasses of wine per week  . Drug use: No  . Sexual activity: Yes  Other Topics Concern  . Not on file  Social History Narrative    He is a single healthy gentleman. He is an avid exerciser at the gym with treadmill, and other exercises routinely. He drinks social alcohol but otherwise is stable.    Social Determinants of Health   Financial Resource Strain:   . Difficulty of Paying Living Expenses:   Food Insecurity:   . Worried About Charity fundraiser in the Last Year:    . Arboriculturist in the Last Year:   Transportation Needs:   . Film/video editor (Medical):   Marland Kitchen Lack of Transportation (Non-Medical):   Physical Activity:   . Days of Exercise per Week:   . Minutes of Exercise per Session:   Stress:   . Feeling of Stress :   Social Connections:   . Frequency of Communication with Friends and Family:   . Frequency of Social Gatherings with Friends and Family:   . Attends Religious Services:   . Active Member of Clubs or Organizations:   . Attends Archivist Meetings:   Marland Kitchen Marital Status:         Objective:  Physical Exam: BP 110/62   Pulse 73   Temp (!) 97 F (36.1 C)   Ht 5' 6"  (1.676 m)   Wt 138 lb (62.6 kg)   SpO2 96%   BMI 22.27 kg/m  Body mass index is 22.27 kg/m. Wt Readings from Last 3 Encounters:  08/18/19 138 lb (62.6 kg)  05/14/19 140 lb 6.1 oz (63.7 kg)  02/05/19 143 lb (64.9 kg)   Gen: NAD, resting comfortably HEENT: TMs normal bilaterally. OP clear. No thyromegaly noted.  CV: RRR with no murmurs appreciated Pulm: NWOB, CTAB with no crackles, wheezes, or rhonchi GI: Normal bowel sounds present. Soft, Nontender, Nondistended. MSK: no edema, cyanosis, or clubbing noted Skin: warm, dry.  Several scattered actinic keratosis noted across scalp, bilateral arms, bilateral legs. Neuro: CN2-12 grossly intact. Strength 5/5 in upper and lower extremities. Reflexes symmetric and intact bilaterally.  Psych: Normal affect and thought content  Cryotherapy Procedure Note  Pre-operative Diagnosis: Actinic keratosis  Locations: Scalp, arms, legs  Indications: Therapeutic  Procedure Details  Patient informed of risks (permanent scarring, infection, light or dark discoloration, bleeding, infection, weakness, numbness and recurrence of the lesion) and benefits of the procedure and verbal informed consent obtained.  The areas are treated with liquid nitrogen therapy, frozen until ice ball extended 3 mm beyond lesion.   A total of 9 lesions were treated. The patient tolerated procedure well.  The patient was instructed on post-op care, warned that there may be blister formation, redness and pain. Recommend OTC analgesia as needed for pain.  Condition: Stable  Complications: none.       Shawn Norris. [name], MD 08/18/2019 2:52 PM

## 2019-08-18 NOTE — Assessment & Plan Note (Signed)
Stable.  Does not want to do daily medication at this point.

## 2019-08-18 NOTE — Assessment & Plan Note (Signed)
Had a panic attack within the last week.  Resolved with Ativan.  Has not had any other issues for quite a while.  Does not want to start any other medications at this point.  Discussed mindfulness and breathing exercises.  Follow-up in 6 months.

## 2019-08-18 NOTE — Assessment & Plan Note (Signed)
Continue simvastatin 40 mg daily.  Will check lipid panel today.

## 2019-08-18 NOTE — Assessment & Plan Note (Signed)
Stable without medications.  Will check PSA, urine culture, and UA.

## 2019-08-18 NOTE — Assessment & Plan Note (Signed)
Stable.  No recent flares.  Continue azathioprine as needed.

## 2019-08-18 NOTE — Assessment & Plan Note (Signed)
Stable.  Continue Ambien 5 mg nightly as needed.

## 2019-08-19 LAB — COMPREHENSIVE METABOLIC PANEL
ALT: 15 U/L (ref 0–53)
AST: 16 U/L (ref 0–37)
Albumin: 4.2 g/dL (ref 3.5–5.2)
Alkaline Phosphatase: 30 U/L — ABNORMAL LOW (ref 39–117)
BUN: 17 mg/dL (ref 6–23)
CO2: 31 mEq/L (ref 19–32)
Calcium: 9.4 mg/dL (ref 8.4–10.5)
Chloride: 101 mEq/L (ref 96–112)
Creatinine, Ser: 1.07 mg/dL (ref 0.40–1.50)
GFR: 69.11 mL/min (ref >60.00)
Glucose, Bld: 87 mg/dL (ref 70–99)
Potassium: 3.9 mEq/L (ref 3.5–5.1)
Sodium: 140 mEq/L (ref 135–145)
Total Bilirubin: 0.5 mg/dL (ref 0.2–1.2)
Total Protein: 6.6 g/dL (ref 6.0–8.3)

## 2019-08-19 LAB — URINALYSIS, ROUTINE W REFLEX MICROSCOPIC
Bilirubin Urine: NEGATIVE
Hgb urine dipstick: NEGATIVE
Ketones, ur: NEGATIVE
Leukocytes,Ua: NEGATIVE
Nitrite: NEGATIVE
RBC / HPF: NONE SEEN (ref 0–?)
Specific Gravity, Urine: 1.02 (ref 1.000–1.030)
Total Protein, Urine: NEGATIVE
Urine Glucose: NEGATIVE
Urobilinogen, UA: 0.2 (ref 0.0–1.0)
WBC, UA: NONE SEEN (ref 0–?)
pH: 7 (ref 5.0–8.0)

## 2019-08-19 LAB — URINE CULTURE
MICRO NUMBER:: 10641922
Result:: NO GROWTH
SPECIMEN QUALITY:: ADEQUATE

## 2019-08-19 LAB — CBC
HCT: 40 % (ref 39.0–52.0)
Hemoglobin: 13.7 g/dL (ref 13.0–17.0)
MCHC: 34.4 g/dL (ref 30.0–36.0)
MCV: 90.5 fl (ref 78.0–100.0)
Platelets: 219 10*3/uL (ref 150.0–400.0)
RBC: 4.41 Mil/uL (ref 4.22–5.81)
RDW: 13.3 % (ref 11.5–15.5)
WBC: 4.3 10*3/uL (ref 4.0–10.5)

## 2019-08-19 LAB — LIPID PANEL
Cholesterol: 138 mg/dL (ref 0–200)
HDL: 54.7 mg/dL (ref >39.00)
LDL Cholesterol: 64 mg/dL (ref 0–99)
NonHDL: 83.4
Total CHOL/HDL Ratio: 3
Triglycerides: 98 mg/dL (ref 0.0–149.0)
VLDL: 19.6 mg/dL (ref 0.0–40.0)

## 2019-08-19 LAB — PSA: PSA: 1.14 ng/mL (ref 0.10–4.00)

## 2019-08-19 LAB — TSH: TSH: 1.26 u[IU]/mL (ref 0.35–4.50)

## 2019-08-19 LAB — HEMOGLOBIN A1C: Hgb A1c MFr Bld: 5.7 % (ref 4.6–6.5)

## 2019-08-20 ENCOUNTER — Other Ambulatory Visit: Payer: Self-pay

## 2019-08-20 DIAGNOSIS — I10 Essential (primary) hypertension: Secondary | ICD-10-CM

## 2019-08-20 DIAGNOSIS — E7800 Pure hypercholesterolemia, unspecified: Secondary | ICD-10-CM

## 2019-08-20 DIAGNOSIS — Z8679 Personal history of other diseases of the circulatory system: Secondary | ICD-10-CM

## 2019-08-20 MED ORDER — SIMVASTATIN 40 MG PO TABS: 40.0000 mg | ORAL_TABLET | Freq: Every day | ORAL | 3 refills | Status: DC

## 2019-08-20 MED ORDER — FLUTICASONE PROPIONATE 50 MCG/ACT NA SUSP: NASAL | 1 refills | Status: DC

## 2019-08-20 MED ORDER — LISINOPRIL-HYDROCHLOROTHIAZIDE 10-12.5 MG PO TABS: 1.0000 | ORAL_TABLET | Freq: Every day | ORAL | 3 refills | Status: DC

## 2019-08-20 MED ORDER — VERAPAMIL HCL 120 MG PO TABS: ORAL_TABLET | ORAL | 3 refills | Status: DC

## 2019-08-20 NOTE — Progress Notes (Signed)
Please inform patient of the following:  Blood work is all NORMAL. Do not need to make any changes to his treatment plan at this time. Would like for him to keep up the good work and we can recheck in a year.  Shawn Norris. [name], MD 08/20/2019 8:27 AM

## 2019-08-28 ENCOUNTER — Ambulatory Visit (INDEPENDENT_AMBULATORY_CARE_PROVIDER_SITE_OTHER): Payer: Medicare Other

## 2019-08-28 DIAGNOSIS — Z0000 Encounter for general adult medical examination without abnormal findings: Secondary | ICD-10-CM | POA: Diagnosis not present

## 2019-08-28 NOTE — Progress Notes (Signed)
Subjective:   Shawn Norris is a 64 y.o. male who presents for an Initial Medicare Annual Wellness Visit. Virtual Visit via Telephone Note  I connected with  Shawn Norris on 08/28/19 at  9:00 AM EDT by telephone and verified that I am speaking with the correct person using two identifiers.  Medicare Annual Wellness visit completed telephonically due to Covid-19 pandemic.   Persons participating in this call: This Health Coach and this patient.   Location: Patient: Home Provider: Office   I discussed the limitations, risks, security and privacy concerns of performing an evaluation and management service by telephone and the availability of in person appointments. The patient expressed understanding and agreed to proceed.  Unable to perform video visit due to video visit attempted and failed and/or patient does not have video capability.   Some vital signs may be absent or patient reported.   [name], LPN       Review of Systems     Cardiac Risk Factors include: hypertension;dyslipidemia;male gender     Objective:    There were no vitals filed for this visit. There is no height or weight on file to calculate BMI.  Advanced Directives 08/28/2019 08/15/2018 06/25/2014  Does Patient Have a Medical Advance Directive? Yes Yes Yes  Type of Advance Directive Living will [place];Living will Living will  Does patient want to make changes to medical advance directive? - No - Patient declined -  Copy of [place] in Chart? - No - copy requested No - copy requested    Current Medications (verified) Outpatient Encounter Medications as of 08/28/2019  Medication Sig  . aspirin EC 81 MG tablet Take 81 mg by mouth daily.  . fluticasone (FLONASE) 50 MCG/ACT nasal spray SHAKE LIQUID AND USE 2 SPRAYS IN EACH NOSTRIL EVERY DAY  . lisinopril-hydrochlorothiazide (ZESTORETIC) 10-12.5 MG tablet Take 1 tablet by mouth daily.  Marland Kitchen LORazepam  (ATIVAN) 0.5 MG tablet Take 1 tablet (0.5 mg total) by mouth every 6 (six) hours as needed. for anxiety  . simvastatin (ZOCOR) 40 MG tablet Take 1 tablet (40 mg total) by mouth at bedtime.  . verapamil (CALAN) 120 MG tablet TAKE 1 TABLET(120 MG) BY MOUTH TWICE DAILY  . zolpidem (AMBIEN) 5 MG tablet TAKE 1 TABLET(5 MG) BY MOUTH AT BEDTIME AS NEEDED FOR SLEEP  . [DISCONTINUED] Olopatadine HCl 0.6 % SOLN Place into the nose. (Patient not taking: Reported on 08/28/2019)   No facility-administered encounter medications on file as of 08/28/2019.    Allergies (verified) Penicillins   History: Past Medical History:  Diagnosis Date  . Allergy   . Anxiety   . Arthritis   . BPH (benign prostatic hypertrophy)   . Crohn's   . Dysuria   . History of PSVT (paroxysmal supraventricular tachycardia)   . Hypercholesterolemia    May 2013 - TC 152, TG 48, HDL 60, LDL 82  . Hypertension   . Inguinal hernia   . Insomnia   . Palpitations   . Prostatitis   . Varicocele    Past Surgical History:  Procedure Laterality Date  . ACHILLES TENDON REPAIR    . INGUINAL HERNIA REPAIR     Family History  Problem Relation Age of Onset  . Dementia Mother    Social History   Socioeconomic History  . Marital status: Single    Spouse name: Not on file  . Number of children: Not on file  . Years of education:  Not on file  . Highest education level: Not on file  Occupational History  . Occupation: Sales   Tobacco Use  . Smoking status: Never Smoker  . Smokeless tobacco: Never Used  Substance and Sexual Activity  . Alcohol use: Yes    Alcohol/week: 4.0 standard drinks    Types: 4 Glasses of wine per week  . Drug use: No  . Sexual activity: Yes  Other Topics Concern  . Not on file  Social History Narrative    He is a single healthy gentleman. He is an avid exerciser at the gym with treadmill, and other exercises routinely. He drinks social alcohol but otherwise is stable.    Social Determinants of  Health   Financial Resource Strain: Low Risk   . Difficulty of Paying Living Expenses: Not hard at all  Food Insecurity: No Food Insecurity  . Worried About Charity fundraiser in the Last Year: Never true  . Ran Out of Food in the Last Year: Never true  Transportation Needs: No Transportation Needs  . Lack of Transportation (Medical): No  . Lack of Transportation (Non-Medical): No  Physical Activity: Unknown  . Days of Exercise per Week: Not on file  . Minutes of Exercise per Session: 60 min  Stress: Stress Concern Present  . Feeling of Stress : To some extent  Social Connections: Socially Isolated  . Frequency of Communication with Friends and Family: More than three times a week  . Frequency of Social Gatherings with Friends and Family: More than three times a week  . Attends Religious Services: Never  . Active Member of Clubs or Organizations: No  . Attends Archivist Meetings: Never  . Marital Status: Never married    Tobacco Counseling Counseling given: Not Answered   Clinical Intake:  Pre-visit preparation completed: Yes        BMI - recorded: 22.28 Nutritional Status: BMI of 19-24  Normal Nutritional Risks: None Diabetes: No  How often do you need to have someone help you when you read instructions, pamphlets, or other written materials from your doctor or pharmacy?: 1 - Never  Diabetic?No     Information entered by :: Charlott Rakes, LPN   Activities of Daily Living In your present state of health, do you have any difficulty performing the following activities: 08/28/2019  Hearing? N  Vision? N  Difficulty concentrating or making decisions? N  Walking or climbing stairs? N  Dressing or bathing? N  Doing errands, shopping? N  Preparing Food and eating ? N  Using the Toilet? N  In the past six months, have you accidently leaked urine? N  Do you have problems with loss of bowel control? N  Managing your Medications? N  Managing your  Finances? N  Housekeeping or managing your Housekeeping? N  Some recent data might be hidden    Patient Care Team: Vivi Barrack, MD as PCP - General (Family Medicine) Irine Seal, MD as Consulting Physician (Urology) Carol Ada, MD as Consulting Physician (Gastroenterology)  Indicate any recent Medical Services you may have received from other than [facility] providers in the past year (date may be approximate).     Assessment:   This is a routine wellness examination for [place].  Hearing/Vision screen  Hearing Screening   125Hz  250Hz  500Hz  1000Hz  2000Hz  3000Hz  4000Hz  6000Hz  8000Hz   Right ear:           Left ear:           Comments:  Pt states mild hearing loss denies hearing aids at this time  Vision Screening Comments: Pt will follow up with annual eye exams  Dietary issues and exercise activities discussed: Current Exercise Habits: Home exercise routine, Type of exercise: walking, Time (Minutes): 60, Frequency (Times/Week): 7, Weekly Exercise (Minutes/Week): 420, Intensity: Moderate  Goals    . Patient Stated     Stay healthy      Depression Screen PHQ 2/9 Scores 08/28/2019 08/15/2018 05/20/2012  PHQ - 2 Score 0 0 0    Fall Risk Fall Risk  08/28/2019 08/15/2018  Falls in the past year? 0 0  Number falls in past yr: 0 -  Injury with Fall? 0 -  Risk for fall due to : Other (Comment) -  Risk for fall due to: Comment Pt has had cataract surgery -  Follow up Falls prevention discussed -    Any stairs in or around the home? Yes  If so, are there any without handrails? Yes  Home free of loose throw rugs in walkways, pet beds, electrical cords, etc? Yes  Adequate lighting in your home to reduce risk of falls? Yes   ASSISTIVE DEVICES UTILIZED TO PREVENT FALLS:  Life alert? No  Use of a cane, walker or w/c? No  Grab bars in the bathroom? No  Shower chair or bench in shower? No  Elevated toilet seat or a handicapped toilet? No   TIMED UP AND GO:  Was the test performed?  No .     Cognitive Function:     6CIT Screen 08/28/2019  What Year? 0 points  What month? 0 points  What time? 0 points  Count back from 20 0 points  Months in reverse 0 points  Repeat phrase 0 points  Total Score 0    Immunizations Immunization History  Administered Date(s) Administered  . Fluad Quad(high Dose 65+) 10/08/2018  . Influenza Inj Mdck Quad Pf 01/12/2018  . Influenza Split 12/19/2010, 11/27/2011  . Influenza,inj,Quad PF,6+ Mos 11/25/2012, 11/21/2013, 12/07/2014, 11/23/2016  . Pneumococcal Conjugate-13 08/15/2018  . Tdap 07/31/2008  . Zoster 12/19/2010    TDAP status: Due, Education has been provided regarding the importance of this vaccine. Advised may receive this vaccine at local pharmacy or Health Dept. Aware to provide a copy of the vaccination record if obtained from local pharmacy or Health Dept. Verbalized acceptance and understanding. Flu Vaccine status: Up to date Pneumococcal vaccine status: Up to date Covid-19 vaccine status: Completed vaccines  Qualifies for Shingles Vaccine? Yes   Zostavax completed Yes   Shingrix Completed?: No.    Education has been provided regarding the importance of this vaccine. Patient has been advised to call insurance company to determine out of pocket expense if they have not yet received this vaccine. Advised may also receive vaccine at local pharmacy or Health Dept. Verbalized acceptance and understanding.  Screening Tests Health Maintenance  Topic Date Due  . COVID-19 Vaccine (1) Never done  . PNA vac Low Risk Adult (2 of 2 - PPSV23) 08/15/2019  . TETANUS/TDAP  05/13/2020 (Originally 08/01/2018)  . INFLUENZA VACCINE  09/21/2019  . COLONOSCOPY  07/29/2024  . Hepatitis C Screening  Completed    Health Maintenance  Health Maintenance Due  Topic Date Due  . COVID-19 Vaccine (1) Never done  . PNA vac Low Risk Adult (2 of 2 - PPSV23) 08/15/2019    Colorectal cancer screening: Completed 07/30/14. Repeat every 10  years   Hepatitis C Screening:  Completed 07/10/16  Vision Screening:  Recommended annual ophthalmology exams for early detection of glaucoma and other disorders of the eye. Is the patient up to date with their annual eye exam?  No  Who is the provider or what is the name of the office in which the patient attends annual eye exams? [facility], Pt states he will follow up and make an appt.   Dental Screening: Recommended annual dental exams for proper oral hygiene  Community Resource Referral / Chronic Care Management: CRR required this visit?  No   CCM required this visit?  No      Plan:     I have personally reviewed and noted the following in the patient's chart:   . Medical and social history . Use of alcohol, tobacco or illicit drugs  . Current medications and supplements . Functional ability and status . Nutritional status . Physical activity . Advanced directives . List of other physicians . Hospitalizations, surgeries, and ER visits in previous 12 months . Vitals . Screenings to include cognitive, depression, and falls . Referrals and appointments  In addition, I have reviewed and discussed with patient certain preventive protocols, quality metrics, and best practice recommendations. A written personalized care plan for preventive services as well as general preventive health recommendations were provided to patient.     [name], LPN   05/29/7024   Nurse Notes: None

## 2019-08-28 NOTE — Patient Instructions (Addendum)
Mr. Shawn Norris , Thank you for taking time to come for your Medicare Wellness Visit. I appreciate your ongoing commitment to your health goals. Please review the following plan we discussed and let me know if I can assist you in the future.   Screening recommendations/referrals: Colonoscopy: Done 07/30/14 Recommended yearly ophthalmology/optometry visit for glaucoma screening and checkup Recommended yearly dental visit for hygiene and checkup  Vaccinations: Influenza vaccine: Up to date Pneumococcal vaccine: Done 08/15/18 with recommendations given Tdap vaccine: Due with information given Shingles vaccine: Shingrix discussed. Please contact your pharmacy for coverage information.  Covid-19: Completed 1/26 & 04/07/19  Advanced directives: Please bring a copy of your health care power of attorney and living will to the office at your convenience.  Conditions/risks identified: Stay healthy and get back into yoga classes  Next appointment: Follow up in one year for your annual wellness visit.   Preventive Care 66 Years and Older, Male Preventive care refers to lifestyle choices and visits with your health care provider that can promote health and wellness. What does preventive care include?  A yearly physical exam. This is also called an annual well check.  Dental exams once or twice a year.  Routine eye exams. Ask your health care provider how often you should have your eyes checked.  Personal lifestyle choices, including:  Daily care of your teeth and gums.  Regular physical activity.  Eating a healthy diet.  Avoiding tobacco and drug use.  Limiting alcohol use.  Practicing safe sex.  Taking low doses of aspirin every day.  Taking vitamin and mineral supplements as recommended by your health care provider. What happens during an annual well check? The services and screenings done by your health care provider during your annual well check will depend on your age, overall  health, lifestyle risk factors, and family history of disease. Counseling  Your health care provider may ask you questions about your:  Alcohol use.  Tobacco use.  Drug use.  Emotional well-being.  Home and relationship well-being.  Sexual activity.  Eating habits.  History of falls.  Memory and ability to understand (cognition).  Work and work Statistician. Screening  You may have the following tests or measurements:  Height, weight, and BMI.  Blood pressure.  Lipid and cholesterol levels. These may be checked every 5 years, or more frequently if you are over 47 years old.  Skin check.  Lung cancer screening. You may have this screening every year starting at age 52 if you have a 30-pack-year history of smoking and currently smoke or have quit within the past 15 years.  Fecal occult blood test (FOBT) of the stool. You may have this test every year starting at age 78.  Flexible sigmoidoscopy or colonoscopy. You may have a sigmoidoscopy every 5 years or a colonoscopy every 10 years starting at age 60.  Prostate cancer screening. Recommendations will vary depending on your family history and other risks.  Hepatitis C blood test.  Hepatitis B blood test.  Sexually transmitted disease (STD) testing.  Diabetes screening. This is done by checking your blood sugar (glucose) after you have not eaten for a while (fasting). You may have this done every 1-3 years.  Abdominal aortic aneurysm (AAA) screening. You may need this if you are a current or former smoker.  Osteoporosis. You may be screened starting at age 66 if you are at high risk. Talk with your health care provider about your test results, treatment options, and if necessary, the need for  more tests. Vaccines  Your health care provider may recommend certain vaccines, such as:  Influenza vaccine. This is recommended every year.  Tetanus, diphtheria, and acellular pertussis (Tdap, Td) vaccine. You may need a Td  booster every 10 years.  Zoster vaccine. You may need this after age 66.  Pneumococcal 13-valent conjugate (PCV13) vaccine. One dose is recommended after age 61.  Pneumococcal polysaccharide (PPSV23) vaccine. One dose is recommended after age 45. Talk to your health care provider about which screenings and vaccines you need and how often you need them. This information is not intended to replace advice given to you by your health care provider. Make sure you discuss any questions you have with your health care provider. Document Released: 03/05/2015 Document Revised: 10/27/2015 Document Reviewed: 12/08/2014 Elsevier Interactive Patient Education  2017 [place] Prevention in the Home Falls can cause injuries. They can happen to people of all ages. There are many things you can do to make your home safe and to help prevent falls. What can I do on the outside of my home?  Regularly fix the edges of walkways and driveways and fix any cracks.  Remove anything that might make you trip as you walk through a door, such as a raised step or threshold.  Trim any bushes or trees on the path to your home.  Use bright outdoor lighting.  Clear any walking paths of anything that might make someone trip, such as rocks or tools.  Regularly check to see if handrails are loose or broken. Make sure that both sides of any steps have handrails.  Any raised decks and porches should have guardrails on the edges.  Have any leaves, snow, or ice cleared regularly.  Use sand or salt on walking paths during winter.  Clean up any spills in your garage right away. This includes oil or grease spills. What can I do in the bathroom?  Use night lights.  Install grab bars by the toilet and in the tub and shower. Do not use towel bars as grab bars.  Use non-skid mats or decals in the tub or shower.  If you need to sit down in the shower, use a plastic, non-slip stool.  Keep the floor dry. Clean up  any water that spills on the floor as soon as it happens.  Remove soap buildup in the tub or shower regularly.  Attach bath mats securely with double-sided non-slip rug tape.  Do not have throw rugs and other things on the floor that can make you trip. What can I do in the bedroom?  Use night lights.  Make sure that you have a light by your bed that is easy to reach.  Do not use any sheets or blankets that are too big for your bed. They should not hang down onto the floor.  Have a firm chair that has side arms. You can use this for support while you get dressed.  Do not have throw rugs and other things on the floor that can make you trip. What can I do in the kitchen?  Clean up any spills right away.  Avoid walking on wet floors.  Keep items that you use a lot in easy-to-reach places.  If you need to reach something above you, use a strong step stool that has a grab bar.  Keep electrical cords out of the way.  Do not use floor polish or wax that makes floors slippery. If you must use wax, use non-skid  floor wax.  Do not have throw rugs and other things on the floor that can make you trip. What can I do with my stairs?  Do not leave any items on the stairs.  Make sure that there are handrails on both sides of the stairs and use them. Fix handrails that are broken or loose. Make sure that handrails are as long as the stairways.  Check any carpeting to make sure that it is firmly attached to the stairs. Fix any carpet that is loose or worn.  Avoid having throw rugs at the top or bottom of the stairs. If you do have throw rugs, attach them to the floor with carpet tape.  Make sure that you have a light switch at the top of the stairs and the bottom of the stairs. If you do not have them, ask someone to add them for you. What else can I do to help prevent falls?  Wear shoes that:  Do not have high heels.  Have rubber bottoms.  Are comfortable and fit you well.  Are  closed at the toe. Do not wear sandals.  If you use a stepladder:  Make sure that it is fully opened. Do not climb a closed stepladder.  Make sure that both sides of the stepladder are locked into place.  Ask someone to hold it for you, if possible.  Clearly mark and make sure that you can see:  Any grab bars or handrails.  First and last steps.  Where the edge of each step is.  Use tools that help you move around (mobility aids) if they are needed. These include:  Canes.  Walkers.  Scooters.  Crutches.  Turn on the lights when you go into a dark area. Replace any light bulbs as soon as they burn out.  Set up your furniture so you have a clear path. Avoid moving your furniture around.  If any of your floors are uneven, fix them.  If there are any pets around you, be aware of where they are.  Review your medicines with your doctor. Some medicines can make you feel dizzy. This can increase your chance of falling. Ask your doctor what other things that you can do to help prevent falls. This information is not intended to replace advice given to you by your health care provider. Make sure you discuss any questions you have with your health care provider. Document Released: 12/03/2008 Document Revised: 07/15/2015 Document Reviewed: 03/13/2014 Elsevier Interactive Patient Education  2017 [employer].

## 2019-09-09 ENCOUNTER — Other Ambulatory Visit: Payer: Self-pay | Admitting: Family Medicine

## 2019-09-09 NOTE — Telephone Encounter (Signed)
Rx Request 

## 2019-10-20 ENCOUNTER — Other Ambulatory Visit: Payer: Self-pay | Admitting: Family Medicine

## 2019-10-20 DIAGNOSIS — E7800 Pure hypercholesterolemia, unspecified: Secondary | ICD-10-CM

## 2019-11-24 ENCOUNTER — Telehealth: Payer: Self-pay

## 2019-11-24 ENCOUNTER — Other Ambulatory Visit: Payer: Self-pay | Admitting: Family Medicine

## 2019-11-24 DIAGNOSIS — I10 Essential (primary) hypertension: Secondary | ICD-10-CM

## 2019-11-24 NOTE — Telephone Encounter (Signed)
Pt called stating he has been taking lorazepam 5 mg for his anxiety. Pt states the medication is not helping and he is still having panic attacks daily. Pt asked if Dr. [name] could increase the dosage or if there would be another medication that would work better. Please advise.

## 2019-11-24 NOTE — Telephone Encounter (Signed)
See below

## 2019-11-24 NOTE — Telephone Encounter (Signed)
Please ask patient to schedule office visit.  Shawn Norris. [name], MD 11/24/2019 3:29 PM

## 2019-11-25 NOTE — Telephone Encounter (Signed)
Pt notified, call transfer to front office for appointment

## 2019-11-27 ENCOUNTER — Telehealth (INDEPENDENT_AMBULATORY_CARE_PROVIDER_SITE_OTHER): Payer: Medicare Other | Admitting: Family Medicine

## 2019-11-27 ENCOUNTER — Encounter: Payer: Self-pay | Admitting: Family Medicine

## 2019-11-27 VITALS — Ht 66.0 in

## 2019-11-27 DIAGNOSIS — F341 Dysthymic disorder: Secondary | ICD-10-CM | POA: Diagnosis not present

## 2019-11-27 DIAGNOSIS — F410 Panic disorder [episodic paroxysmal anxiety] without agoraphobia: Secondary | ICD-10-CM

## 2019-11-27 MED ORDER — LORAZEPAM 1 MG PO TABS
0.5000 mg | ORAL_TABLET | Freq: Three times a day (TID) | ORAL | 5 refills | Status: DC | PRN
Start: 2019-11-27 — End: 2019-11-27

## 2019-11-27 MED ORDER — LORAZEPAM 1 MG PO TABS
1.0000 mg | ORAL_TABLET | Freq: Three times a day (TID) | ORAL | 5 refills | Status: DC | PRN
Start: 2019-11-27 — End: 2020-05-25

## 2019-11-27 MED ORDER — BUSPIRONE HCL 5 MG PO TABS: 5.0000 mg | ORAL_TABLET | Freq: Two times a day (BID) | ORAL | 1 refills | Status: DC

## 2019-11-27 NOTE — Assessment & Plan Note (Signed)
Worsened.  Has been dealing with more stress due to his aging father.  We will start BuSpar 5 mg twice daily.  Also increase Ativan to 57m q8hrs as needed.  He will follow-up with me in a few weeks.

## 2019-11-27 NOTE — Progress Notes (Signed)
   HAIDEN RAWLINSON is a 64 y.o. male who presents today for a virtual office visit.  Assessment/Plan:  Chronic Problems Addressed Today: Dysthymia Worsened.  Has been dealing with more stress due to his aging father.  We will start BuSpar 5 mg twice daily.  Also increase Ativan to 3m q8hrs as needed.  He will follow-up with me in a few weeks.  Panic attacks See above.  Symptoms have worsened.  We will start BuSpar 5 mg twice daily and increase Ativan to 1 mg every 8 hours as needed.    Subjective:  HPI:  See A/p.         Objective/Observations  Physical Exam: Gen: NAD, resting comfortably Pulm: Normal work of breathing Neuro: Grossly normal, moves all extremities Psych: Normal affect and thought content  Virtual Visit via Video   I connected with MMarica Otteron 11/27/19 at  1:00 PM EDT by a video enabled telemedicine application and verified that I am speaking with the correct person using two identifiers. The limitations of evaluation and management by telemedicine and the availability of in person appointments were discussed. The patient expressed understanding and agreed to proceed.   [place] Myself and Patient     CAlgis Greenhouse PJerline Pain MD 11/27/2019 1:30 PM

## 2019-11-27 NOTE — Assessment & Plan Note (Signed)
See above.  Symptoms have worsened.  We will start BuSpar 5 mg twice daily and increase Ativan to 1 mg every 8 hours as needed.

## 2019-12-18 ENCOUNTER — Other Ambulatory Visit: Payer: Self-pay

## 2019-12-18 ENCOUNTER — Encounter: Payer: Self-pay | Admitting: Family Medicine

## 2019-12-18 ENCOUNTER — Ambulatory Visit (INDEPENDENT_AMBULATORY_CARE_PROVIDER_SITE_OTHER): Payer: Medicare Other

## 2019-12-18 DIAGNOSIS — Z23 Encounter for immunization: Secondary | ICD-10-CM

## 2020-01-12 DIAGNOSIS — H40053 Ocular hypertension, bilateral: Secondary | ICD-10-CM | POA: Diagnosis not present

## 2020-01-16 ENCOUNTER — Other Ambulatory Visit: Payer: Self-pay | Admitting: Family Medicine

## 2020-01-16 DIAGNOSIS — Z8679 Personal history of other diseases of the circulatory system: Secondary | ICD-10-CM

## 2020-01-16 DIAGNOSIS — I10 Essential (primary) hypertension: Secondary | ICD-10-CM

## 2020-02-09 ENCOUNTER — Telehealth (INDEPENDENT_AMBULATORY_CARE_PROVIDER_SITE_OTHER): Payer: Medicare Other | Admitting: Physician Assistant

## 2020-02-09 ENCOUNTER — Encounter: Payer: Self-pay | Admitting: Physician Assistant

## 2020-02-09 ENCOUNTER — Other Ambulatory Visit: Payer: Self-pay

## 2020-02-09 VITALS — BP 126/70 | HR 89 | Temp 98.1°F | Ht 66.0 in | Wt 130.0 lb

## 2020-02-09 DIAGNOSIS — J029 Acute pharyngitis, unspecified: Secondary | ICD-10-CM | POA: Diagnosis not present

## 2020-02-09 LAB — POCT RAPID STREP A (OFFICE): Rapid Strep A Screen: NEGATIVE

## 2020-02-09 MED ORDER — MAGIC MOUTHWASH W/LIDOCAINE: 5.0000 mL | Freq: Three times a day (TID) | ORAL | 0 refills | Status: DC | PRN

## 2020-02-09 MED ORDER — PREDNISONE 20 MG PO TABS: 40.0000 mg | ORAL_TABLET | Freq: Every day | ORAL | 0 refills | Status: DC

## 2020-02-09 MED ORDER — CLINDAMYCIN HCL 300 MG PO CAPS: 300.0000 mg | ORAL_CAPSULE | Freq: Three times a day (TID) | ORAL | 0 refills | Status: AC

## 2020-02-09 NOTE — Progress Notes (Signed)
Virtual Visit via Video   I connected with Shawn Norris on 02/09/20 at 11:00 AM EST by a video enabled telemedicine application and verified that I am speaking with the correct person using two identifiers. Location patient: Home Location provider: [facility], Office Persons participating in the virtual visit: Shawn Norris, Pinedo PA-C, Shawn Pickler, LPN   I discussed the limitations of evaluation and management by telemedicine and the availability of in person appointments. The patient expressed understanding and agreed to proceed.  I acted as a Education administrator for [employer], [employer], LPN   Subjective:   HPI:   Sinus problem Pt c/o cough and nasal congestion, started 3 days ago. Coughing and expectorating yellow sputum. Green nasal drainage. Pt said last night started with sore throat and says one tonsil is swollen. Pt has been taking Aleve and Flonase, and saline nasal spray. Has had 2 negative COVID tests.  Denies: fever, chills, poor appetite  Has a history of sinus infections, feels like similar presentation in the past.  He is worried about why his R tonsil is so much larger than this L. Having some pain with swallowing solid foods.  ROS: See pertinent positives and negatives per HPI.  Patient Active Problem List   Diagnosis Date Noted  . Actinic keratosis 08/18/2019  . BPH (benign prostatic hyperplasia) 05/14/2019  . Panic attacks 01/28/2019  . Sleep disturbance 01/28/2019  . PSVT (paroxysmal supraventricular tachycardia) ([place]) 08/15/2018  . Allergic rhinitis 06/17/2013  . Dysthymia 06/17/2013  . Actinic keratosis of scalp 01/08/2013  . Hypertension 07/03/2011  . Hyperlipidemia LDL goal <100 07/03/2011  . Crohn's disease ([place]) 07/03/2011    Social History   Tobacco Use  . Smoking status: Never Smoker  . Smokeless tobacco: Never Used  Substance Use Topics  . Alcohol use: Yes    Alcohol/week: 4.0 standard drinks    Types: 4  Glasses of wine per week    Current Outpatient Medications:  .  aspirin EC 81 MG tablet, Take 81 mg by mouth daily., Disp: , Rfl:  .  busPIRone (BUSPAR) 5 MG tablet, Take 1 tablet (5 mg total) by mouth 2 (two) times daily., Disp: 60 tablet, Rfl: 1 .  fluticasone (FLONASE) 50 MCG/ACT nasal spray, SHAKE LIQUID AND USE 2 SPRAYS IN EACH NOSTRIL EVERY DAY, Disp: 48 g, Rfl: 1 .  lisinopril-hydrochlorothiazide (ZESTORETIC) 10-12.5 MG tablet, TAKE 1 TABLET BY MOUTH DAILY, Disp: 90 tablet, Rfl: 3 .  LORazepam (ATIVAN) 1 MG tablet, Take 1 tablet (1 mg total) by mouth every 8 (eight) hours as needed. for anxiety, Disp: 30 tablet, Rfl: 5 .  simvastatin (ZOCOR) 40 MG tablet, TAKE 1 TABLET(40 MG) BY MOUTH AT BEDTIME, Disp: 90 tablet, Rfl: 0 .  verapamil (CALAN) 120 MG tablet, TAKE 1 TABLET(120 MG) BY MOUTH TWICE DAILY, Disp: 180 tablet, Rfl: 3 .  zolpidem (AMBIEN) 5 MG tablet, TAKE 1 TABLET(5 MG) BY MOUTH AT BEDTIME AS NEEDED FOR SLEEP, Disp: 30 tablet, Rfl: 5 .  clindamycin (CLEOCIN) 300 MG capsule, Take 1 capsule (300 mg total) by mouth 3 (three) times daily for 7 days., Disp: 21 capsule, Rfl: 0 .  magic mouthwash w/lidocaine SOLN, Take 5 mLs by mouth 3 (three) times daily as needed for mouth pain., Disp: 70 mL, Rfl: 0 .  predniSONE (DELTASONE) 20 MG tablet, Take 2 tablets (40 mg total) by mouth daily., Disp: 10 tablet, Rfl: 0  Allergies  Allergen Reactions  . Penicillins Rash  Objective:   VITALS: Per patient if applicable, see vitals. GENERAL: Alert, appears well and in no acute distress. HEENT: Atraumatic, conjunctiva clear, no obvious abnormalities on inspection of external nose and ears. R tonsil 3+, L tonsil 0 NECK: Normal movements of the head and neck. CARDIOPULMONARY: No increased WOB. Speaking in clear sentences. I:E ratio WNL.  MS: Moves all visible extremities without noticeable abnormality. PSYCH: Pleasant and cooperative, well-groomed. Speech normal rate and rhythm. Affect is  appropriate. Insight and judgement are appropriate. Attention is focused, linear, and appropriate.  NEURO: CN grossly intact. Oriented as arrived to appointment on time with no prompting. Moves both UE equally.  SKIN: No obvious lesions, wounds, erythema, or cyanosis noted on face or hands.  Assessment and Plan:   Shawn Norris was seen today for sinus problem and sore throat.  Diagnoses and all orders for this visit:  Pharyngitis, unspecified etiology Strep test negative. Reviewed with PCP, [name]. No systemic symptoms but given presentation of tonsils, will start clindamycin and prednisone to cover for possible early abscess. I have also prescribed magic mouthwash. If any new symptoms or lack of improvement, discussed with patient that he needs to let us know so we can order CT. -     POCT rapid strep A  Other orders -     clindamycin (CLEOCIN) 300 MG capsule; Take 1 capsule (300 mg total) by mouth 3 (three) times daily for 7 days. -     predniSONE (DELTASONE) 20 MG tablet; Take 2 tablets (40 mg total) by mouth daily. -     magic mouthwash w/lidocaine SOLN; Take 5 mLs by mouth 3 (three) times daily as needed for mouth pain.    I discussed the assessment and treatment plan with the patient. The patient was provided an opportunity to ask questions and all were answered. The patient agreed with the plan and demonstrated an understanding of the instructions.   The patient was advised to call back or seek an in-person evaluation if the symptoms worsen or if the condition fails to improve as anticipated.   CMA or LPN served as scribe during this visit. History, Physical, and Plan performed by medical provider. The above documentation has been reviewed and is accurate and complete.  [place], [place] 02/09/2020

## 2020-02-09 NOTE — Patient Instructions (Signed)
It was great to see you!  Start oral clindamycin antibiotic. Start oral prednisone.  Use the magic mouthwash as needed for pain.  Get help right away if:  You have a stiff neck.  You drool or cannot swallow liquids.  You cannot drink or take medicines without throwing up.  You have very bad pain that does not go away with medicine.  You have problems breathing, and it is not from a stuffy nose.  You have new pain and swelling in your knees, ankles, wrists, or elbows.  Please let me or Dr. [name] know if you have any new or worsening symptoms after starting these medications.  Take care,  Inda Coke PA-C

## 2020-02-11 ENCOUNTER — Telehealth: Payer: Self-pay

## 2020-02-11 ENCOUNTER — Other Ambulatory Visit: Payer: Self-pay | Admitting: Family Medicine

## 2020-02-11 NOTE — Telephone Encounter (Signed)
Please call patient.  He can trial flonase in both nostrils BID or over the counter mucinex for his symptoms.

## 2020-02-11 NOTE — Telephone Encounter (Signed)
Patient called in wanting to update Shawn Norris, his throat is better but the head and chest  Congestion is still really bad. Wanted to know what else to do, or if he should come get a PCR.

## 2020-02-11 NOTE — Telephone Encounter (Signed)
Spoke to pt told him per Aldona Bar He can trial flonase in both nostrils BID or over the counter mucinex for his symptoms. Pt verbalized understanding.

## 2020-02-16 ENCOUNTER — Ambulatory Visit: Payer: Medicare Other | Admitting: Family Medicine

## 2020-02-16 ENCOUNTER — Telehealth: Payer: Self-pay

## 2020-02-16 NOTE — Telephone Encounter (Signed)
Pt is still feeling unwell from his appointment last week. He still is coughing up yellow phlegm. Pt is not sure if it normally lasts this long. He says it will have been a week now and he is about out of medication.

## 2020-02-16 NOTE — Telephone Encounter (Signed)
Please see message and advise 

## 2020-02-16 NOTE — Telephone Encounter (Signed)
Coughs can linger. Please continue to take oral mucinex dm if able.  If cough doesn't continue to improve or he develops new sx, please let us know.

## 2020-02-16 NOTE — Telephone Encounter (Signed)
Spoke to pt told him per Aldona Bar, Coughs can linger. Please continue to take oral mucinex dm if able. Pt said he has already taken a whole bottle. Asked pt if it was Mucinex DM? Pt said no told him have the pharmacy help him find Mucinex DM not plain Mucinex. Also you can try OTC Delsym cough syrup. Pt verbalized understanding.  If cough doesn't continue to improve or he develops new sx, please let us know. Pt verbalized understanding.

## 2020-02-16 NOTE — Telephone Encounter (Signed)
Spoke to pt c/o head congestion and runny nose, clear to light yellow nasal drainage. Pt is coughing up light yellow/green sputum in the morning. Pt said he has completed all medication prescribed. Cough is not worse but still bothersome. Denies fever or chills. Told pt I will get back to him after Aldona Bar reviews the symptoms. Pt verbalized understanding.

## 2020-02-16 NOTE — Telephone Encounter (Signed)
Please call and see what symptoms he is having. Cough can last up to 6-8 weeks after infection.  If WORSENING cough, I would recommend CXR which we can order for patient if needed.

## 2020-02-26 ENCOUNTER — Telehealth: Payer: Self-pay

## 2020-02-26 NOTE — Telephone Encounter (Signed)
  LAST APPOINTMENT DATE: 02/16/2020   NEXT APPOINTMENT DATE:@Visit  date not found  MEDICATION:zolpidem (AMBIEN) 5 MG tablet  PHARMACY:[id number] - [place], [place] - [address] AT [facility] OF [address] & [place]: Patient states he is going on a business trip for 9 days and states that he will be out while on the trip. Asked for an early refill. Patient is due to leave Saturday.   Please advise

## 2020-02-26 NOTE — Telephone Encounter (Signed)
Pt requesting Rx Ambien refill

## 2020-02-27 ENCOUNTER — Other Ambulatory Visit: Payer: Self-pay | Admitting: *Deleted

## 2020-02-27 ENCOUNTER — Other Ambulatory Visit: Payer: Self-pay | Admitting: Physician Assistant

## 2020-02-27 MED ORDER — ZOLPIDEM TARTRATE 5 MG PO TABS: ORAL_TABLET | ORAL | 0 refills | Status: DC

## 2020-02-27 NOTE — Telephone Encounter (Signed)
Pt called following up on this message. Pt states he needs this before Sunday because he is leaving for 9 days. Can we get another provider to send this in? Please advise.

## 2020-02-27 NOTE — Telephone Encounter (Signed)
I will send in one month's worth due to urgent need.  Further refills from PCP.

## 2020-02-27 NOTE — Telephone Encounter (Signed)
Pt aware Rx send to [place]

## 2020-02-27 NOTE — Telephone Encounter (Signed)
Pt.notified

## 2020-02-27 NOTE — Telephone Encounter (Signed)
Pt requesting refill Rx Ambien  Last OV 02/09/2020 Last refill on 09/10/2018  # 30 Rf 5  Per preview phone note: Pt states he needs this before Sunday because he is leaving for 9 days. Can we get another provider to send this in? Please advise.

## 2020-03-01 NOTE — Telephone Encounter (Signed)
Patient called in stating the refill was sent but there was no note that states he could get an early refill so they could not refill it. Has one pill left, asked for this to be sent in, and for a phone call.   Asking for the refill to be sent to [facility]; Address: [address], [place] [address]. Phone number is [phone].

## 2020-03-02 ENCOUNTER — Other Ambulatory Visit: Payer: Self-pay | Admitting: *Deleted

## 2020-03-02 ENCOUNTER — Telehealth: Payer: Self-pay | Admitting: *Deleted

## 2020-03-02 MED ORDER — ZOLPIDEM TARTRATE 5 MG PO TABS: ORAL_TABLET | ORAL | 0 refills | Status: DC

## 2020-03-02 NOTE — Telephone Encounter (Signed)
Can we send this to pharmacy please and call patient when its been sent

## 2020-03-02 NOTE — Telephone Encounter (Signed)
Patient notified Rx send to pharmacy of choice

## 2020-03-02 NOTE — Telephone Encounter (Signed)
Rx cancelled at [facility] in [place]  Can this Rx be send to [place] in [place]

## 2020-03-11 ENCOUNTER — Telehealth: Payer: Self-pay

## 2020-03-11 NOTE — Telephone Encounter (Signed)
Usually this comes from GI. Has he contacted his GI doctor?  Shawn Norris. [name], MD 03/11/2020 4:07 PM

## 2020-03-11 NOTE — Telephone Encounter (Signed)
  LAST APPOINTMENT DATE: 08/18/2019   NEXT APPOINTMENT DATE:@7 /15/2022   MEDICATION: azathioprine 50 MG   PHARMACY: [place],  - [address] AT [facility] OF [address]  COMMENTS: Patient is needing a refill   Please advise

## 2020-03-11 NOTE — Telephone Encounter (Signed)
Not on pt current med list, ok for refill?

## 2020-03-12 NOTE — Telephone Encounter (Signed)
Pt stated has appointment with GI on Monday

## 2020-03-15 DIAGNOSIS — R1031 Right lower quadrant pain: Secondary | ICD-10-CM | POA: Diagnosis not present

## 2020-03-15 DIAGNOSIS — K5090 Crohn's disease, unspecified, without complications: Secondary | ICD-10-CM | POA: Diagnosis not present

## 2020-03-15 DIAGNOSIS — K5900 Constipation, unspecified: Secondary | ICD-10-CM | POA: Diagnosis not present

## 2020-03-28 ENCOUNTER — Other Ambulatory Visit: Payer: Self-pay | Admitting: Family Medicine

## 2020-03-29 NOTE — Telephone Encounter (Signed)
Ambien last rx 03/02/20 #30 LOV: 11/27/19 Panic attacks

## 2020-05-24 ENCOUNTER — Other Ambulatory Visit: Payer: Self-pay | Admitting: Family Medicine

## 2020-05-25 NOTE — Telephone Encounter (Signed)
Last Refill 04/27/2020 Last OV 02/09/2020 (video visit) dx pharyngitis

## 2020-07-05 ENCOUNTER — Telehealth: Payer: Self-pay

## 2020-07-05 NOTE — Telephone Encounter (Signed)
Patient believes that he needs his medication adjusted for depression.  States this is not working.  I have scheduled him an appt for Thursday at 5/19.   Patient would like to discuss possibly changes before this appt.    Please follow up in regard.

## 2020-07-05 NOTE — Telephone Encounter (Signed)
Spoke with patient he will discuss medication at appt 05/19

## 2020-07-08 ENCOUNTER — Ambulatory Visit (INDEPENDENT_AMBULATORY_CARE_PROVIDER_SITE_OTHER): Payer: Medicare Other | Admitting: Family Medicine

## 2020-07-08 ENCOUNTER — Encounter: Payer: Self-pay | Admitting: Family Medicine

## 2020-07-08 ENCOUNTER — Other Ambulatory Visit: Payer: Self-pay

## 2020-07-08 VITALS — BP 97/59 | HR 60 | Temp 98.0°F | Ht 66.0 in | Wt 130.0 lb

## 2020-07-08 DIAGNOSIS — F410 Panic disorder [episodic paroxysmal anxiety] without agoraphobia: Secondary | ICD-10-CM

## 2020-07-08 DIAGNOSIS — F341 Dysthymic disorder: Secondary | ICD-10-CM | POA: Diagnosis not present

## 2020-07-08 MED ORDER — CITALOPRAM HYDROBROMIDE 20 MG PO TABS: 20.0000 mg | ORAL_TABLET | Freq: Every day | ORAL | 3 refills | Status: DC

## 2020-07-08 MED ORDER — LORAZEPAM 1 MG PO TABS: 1.0000 mg | ORAL_TABLET | Freq: Three times a day (TID) | ORAL | 5 refills | Status: DC | PRN

## 2020-07-08 NOTE — Patient Instructions (Signed)
It was very nice to see you today!  We will start Celexa.  I will send in more Ativan.  Please send me a message in a few weeks to let me know how this is working for you.  He can continue taking Ativan as you have been before but ideally you will need it less with the celexa.  Take care, Dr [name]  PLEASE NOTE:  If you had any lab tests please let us know if you have not heard back within a few days. You may see your results on mychart before we have a chance to review them but we will give you a call once they are reviewed by [place]. If we ordered any referrals today, please let us know if you have not heard from their office within the next week.   Please try these tips to maintain a healthy lifestyle:   Eat at least 3 REAL meals and 1-2 snacks per day.  Aim for no more than 5 hours between eating.  If you eat breakfast, please do so within one hour of getting up.    Each meal should contain half fruits/vegetables, one quarter protein, and one quarter carbs (no bigger than a computer mouse)   Cut down on sweet beverages. This includes juice, soda, and sweet tea.     Drink at least 1 glass of water with each meal and aim for at least 8 glasses per day   Exercise at least 150 minutes every week.

## 2020-07-08 NOTE — Progress Notes (Signed)
   Shawn Norris is a 65 y.o. male who presents today for an office visit.  Assessment/Plan:  Chronic Problems Addressed Today: Panic attacks Did not have any improvement with BuSpar.  He has been taking Ativan which has worked well.  We discussed treatment options.  We will start low-dose Celexa 20 mg daily and he will continue his current dose of Ativan for now.  We will refill Ativan today.  He will follow-up with me in a couple weeks via MyChart.  If no improvement with Celexa would consider alternative SSRI.  Dysthymia See above.  Symptoms are not controlled.  BuSpar did not work well.  We will continue with Ativan 1 mg every 8 hours as needed and start Celexa 20 mg daily.  Follow-up with me in a few weeks via MyChart.     Subjective:  HPI:  See a/p.         Objective:  Physical Exam: BP (!) 97/59   Pulse 60   Temp 98 F (36.7 C) (Temporal)   Ht 5' 6"  (1.676 m)   Wt 130 lb (59 kg)   SpO2 99%   BMI 20.98 kg/m   Gen: No acute distress, resting comfortably Neuro: Grossly normal, moves all extremities Psych: Normal affect and thought content      Shawn Norris M. Shawn Pain, MD 07/08/2020 12:21 PM

## 2020-07-08 NOTE — Assessment & Plan Note (Signed)
See above.  Symptoms are not controlled.  BuSpar did not work well.  We will continue with Ativan 1 mg every 8 hours as needed and start Celexa 20 mg daily.  Follow-up with me in a few weeks via MyChart.

## 2020-07-08 NOTE — Assessment & Plan Note (Signed)
Did not have any improvement with BuSpar.  He has been taking Ativan which has worked well.  We discussed treatment options.  We will start low-dose Celexa 20 mg daily and he will continue his current dose of Ativan for now.  We will refill Ativan today.  He will follow-up with me in a couple weeks via MyChart.  If no improvement with Celexa would consider alternative SSRI.

## 2020-08-02 DIAGNOSIS — C44629 Squamous cell carcinoma of skin of left upper limb, including shoulder: Secondary | ICD-10-CM | POA: Diagnosis not present

## 2020-08-02 DIAGNOSIS — L820 Inflamed seborrheic keratosis: Secondary | ICD-10-CM | POA: Diagnosis not present

## 2020-08-02 DIAGNOSIS — L304 Erythema intertrigo: Secondary | ICD-10-CM | POA: Diagnosis not present

## 2020-08-02 DIAGNOSIS — L821 Other seborrheic keratosis: Secondary | ICD-10-CM | POA: Diagnosis not present

## 2020-08-02 DIAGNOSIS — D1801 Hemangioma of skin and subcutaneous tissue: Secondary | ICD-10-CM | POA: Diagnosis not present

## 2020-09-03 ENCOUNTER — Ambulatory Visit: Payer: Medicare Other

## 2020-09-17 DIAGNOSIS — C44629 Squamous cell carcinoma of skin of left upper limb, including shoulder: Secondary | ICD-10-CM | POA: Diagnosis not present

## 2020-09-17 DIAGNOSIS — L989 Disorder of the skin and subcutaneous tissue, unspecified: Secondary | ICD-10-CM | POA: Diagnosis not present

## 2020-09-19 ENCOUNTER — Other Ambulatory Visit: Payer: Self-pay | Admitting: Family Medicine

## 2020-09-28 ENCOUNTER — Other Ambulatory Visit: Payer: Self-pay | Admitting: Family Medicine

## 2020-09-28 DIAGNOSIS — Z8679 Personal history of other diseases of the circulatory system: Secondary | ICD-10-CM

## 2020-09-28 DIAGNOSIS — E7800 Pure hypercholesterolemia, unspecified: Secondary | ICD-10-CM

## 2020-09-28 DIAGNOSIS — I10 Essential (primary) hypertension: Secondary | ICD-10-CM

## 2020-09-30 DIAGNOSIS — L570 Actinic keratosis: Secondary | ICD-10-CM | POA: Diagnosis not present

## 2020-09-30 DIAGNOSIS — D485 Neoplasm of uncertain behavior of skin: Secondary | ICD-10-CM | POA: Diagnosis not present

## 2020-09-30 DIAGNOSIS — B079 Viral wart, unspecified: Secondary | ICD-10-CM | POA: Diagnosis not present

## 2020-10-28 ENCOUNTER — Other Ambulatory Visit: Payer: Self-pay | Admitting: Family Medicine

## 2020-11-11 ENCOUNTER — Other Ambulatory Visit: Payer: Self-pay | Admitting: Family Medicine

## 2020-11-11 DIAGNOSIS — I10 Essential (primary) hypertension: Secondary | ICD-10-CM

## 2020-11-25 ENCOUNTER — Ambulatory Visit (INDEPENDENT_AMBULATORY_CARE_PROVIDER_SITE_OTHER): Payer: Medicare Other

## 2020-11-25 DIAGNOSIS — Z0000 Encounter for general adult medical examination without abnormal findings: Secondary | ICD-10-CM | POA: Diagnosis not present

## 2020-11-25 NOTE — Patient Instructions (Signed)
Shawn Norris , Thank you for taking time to come for your Medicare Wellness Visit. I appreciate your ongoing commitment to your health goals. Please review the following plan we discussed and let me know if I can assist you in the future.   Screening recommendations/referrals: Colonoscopy: Done 07/30/14 repeat every 10 years due 07/29/24 Recommended yearly ophthalmology/optometry visit for glaucoma screening and checkup Recommended yearly dental visit for hygiene and checkup  Vaccinations: Influenza vaccine: Due  Pneumococcal vaccine: Up to date Tdap vaccine: Due and dicussed Shingles vaccine: Shingrix discussed. Please contact your pharmacy for coverage information.    Covid-19: Completed 1/26, 2/15, 11/20/19  Advanced directives: Please bring a copy of your health care power of attorney and living will to the office at your convenience.  Conditions/risks identified: Stay Healthy  Next appointment: Follow up in one year for your annual wellness visit.   Preventive Care 67 Years and Older, Male Preventive care refers to lifestyle choices and visits with your health care provider that can promote health and wellness. What does preventive care include? A yearly physical exam. This is also called an annual well check. Dental exams once or twice a year. Routine eye exams. Ask your health care provider how often you should have your eyes checked. Personal lifestyle choices, including: Daily care of your teeth and gums. Regular physical activity. Eating a healthy diet. Avoiding tobacco and drug use. Limiting alcohol use. Practicing safe sex. Taking low doses of aspirin every day. Taking vitamin and mineral supplements as recommended by your health care provider. What happens during an annual well check? The services and screenings done by your health care provider during your annual well check will depend on your age, overall health, lifestyle risk factors, and family history of  disease. Counseling  Your health care provider may ask you questions about your: Alcohol use. Tobacco use. Drug use. Emotional well-being. Home and relationship well-being. Sexual activity. Eating habits. History of falls. Memory and ability to understand (cognition). Work and work Statistician. Screening  You may have the following tests or measurements: Height, weight, and BMI. Blood pressure. Lipid and cholesterol levels. These may be checked every 5 years, or more frequently if you are over 74 years old. Skin check. Lung cancer screening. You may have this screening every year starting at age 58 if you have a 30-pack-year history of smoking and currently smoke or have quit within the past 15 years. Fecal occult blood test (FOBT) of the stool. You may have this test every year starting at age 63. Flexible sigmoidoscopy or colonoscopy. You may have a sigmoidoscopy every 5 years or a colonoscopy every 10 years starting at age 73. Prostate cancer screening. Recommendations will vary depending on your family history and other risks. Hepatitis C blood test. Hepatitis B blood test. Sexually transmitted disease (STD) testing. Diabetes screening. This is done by checking your blood sugar (glucose) after you have not eaten for a while (fasting). You may have this done every 1-3 years. Abdominal aortic aneurysm (AAA) screening. You may need this if you are a current or former smoker. Osteoporosis. You may be screened starting at age 26 if you are at high risk. Talk with your health care provider about your test results, treatment options, and if necessary, the need for more tests. Vaccines  Your health care provider may recommend certain vaccines, such as: Influenza vaccine. This is recommended every year. Tetanus, diphtheria, and acellular pertussis (Tdap, Td) vaccine. You may need a Td booster every 10 years.  Zoster vaccine. You may need this after age 60. Pneumococcal 13-valent  conjugate (PCV13) vaccine. One dose is recommended after age 65. Pneumococcal polysaccharide (PPSV23) vaccine. One dose is recommended after age 97. Talk to your health care provider about which screenings and vaccines you need and how often you need them. This information is not intended to replace advice given to you by your health care provider. Make sure you discuss any questions you have with your health care provider. Document Released: 03/05/2015 Document Revised: 10/27/2015 Document Reviewed: 12/08/2014 Elsevier Interactive Patient Education  2017 [place] Prevention in the Home Falls can cause injuries. They can happen to people of all ages. There are many things you can do to make your home safe and to help prevent falls. What can I do on the outside of my home? Regularly fix the edges of walkways and driveways and fix any cracks. Remove anything that might make you trip as you walk through a door, such as a raised step or threshold. Trim any bushes or trees on the path to your home. Use bright outdoor lighting. Clear any walking paths of anything that might make someone trip, such as rocks or tools. Regularly check to see if handrails are loose or broken. Make sure that both sides of any steps have handrails. Any raised decks and porches should have guardrails on the edges. Have any leaves, snow, or ice cleared regularly. Use sand or salt on walking paths during winter. Clean up any spills in your garage right away. This includes oil or grease spills. What can I do in the bathroom? Use night lights. Install grab bars by the toilet and in the tub and shower. Do not use towel bars as grab bars. Use non-skid mats or decals in the tub or shower. If you need to sit down in the shower, use a plastic, non-slip stool. Keep the floor dry. Clean up any water that spills on the floor as soon as it happens. Remove soap buildup in the tub or shower regularly. Attach bath mats  securely with double-sided non-slip rug tape. Do not have throw rugs and other things on the floor that can make you trip. What can I do in the bedroom? Use night lights. Make sure that you have a light by your bed that is easy to reach. Do not use any sheets or blankets that are too big for your bed. They should not hang down onto the floor. Have a firm chair that has side arms. You can use this for support while you get dressed. Do not have throw rugs and other things on the floor that can make you trip. What can I do in the kitchen? Clean up any spills right away. Avoid walking on wet floors. Keep items that you use a lot in easy-to-reach places. If you need to reach something above you, use a strong step stool that has a grab bar. Keep electrical cords out of the way. Do not use floor polish or wax that makes floors slippery. If you must use wax, use non-skid floor wax. Do not have throw rugs and other things on the floor that can make you trip. What can I do with my stairs? Do not leave any items on the stairs. Make sure that there are handrails on both sides of the stairs and use them. Fix handrails that are broken or loose. Make sure that handrails are as long as the stairways. Check any carpeting to make sure that  it is firmly attached to the stairs. Fix any carpet that is loose or worn. Avoid having throw rugs at the top or bottom of the stairs. If you do have throw rugs, attach them to the floor with carpet tape. Make sure that you have a light switch at the top of the stairs and the bottom of the stairs. If you do not have them, ask someone to add them for you. What else can I do to help prevent falls? Wear shoes that: Do not have high heels. Have rubber bottoms. Are comfortable and fit you well. Are closed at the toe. Do not wear sandals. If you use a stepladder: Make sure that it is fully opened. Do not climb a closed stepladder. Make sure that both sides of the stepladder  are locked into place. Ask someone to hold it for you, if possible. Clearly mark and make sure that you can see: Any grab bars or handrails. First and last steps. Where the edge of each step is. Use tools that help you move around (mobility aids) if they are needed. These include: Canes. Walkers. Scooters. Crutches. Turn on the lights when you go into a dark area. Replace any light bulbs as soon as they burn out. Set up your furniture so you have a clear path. Avoid moving your furniture around. If any of your floors are uneven, fix them. If there are any pets around you, be aware of where they are. Review your medicines with your doctor. Some medicines can make you feel dizzy. This can increase your chance of falling. Ask your doctor what other things that you can do to help prevent falls. This information is not intended to replace advice given to you by your health care provider. Make sure you discuss any questions you have with your health care provider. Document Released: 12/03/2008 Document Revised: 07/15/2015 Document Reviewed: 03/13/2014 Elsevier Interactive Patient Education  2017 [employer].

## 2020-11-25 NOTE — Progress Notes (Addendum)
Virtual Visit via Telephone Note  I connected with  Shawn Norris on 11/25/20 at  8:45 AM EDT by telephone and verified that I am speaking with the correct person using two identifiers.  Medicare Annual Wellness visit completed telephonically due to Covid-19 pandemic.   Persons participating in this call: This Health Coach and this patient.   Location: Patient: Home Provider: Office   I discussed the limitations, risks, security and privacy concerns of performing an evaluation and management service by telephone and the availability of in person appointments. The patient expressed understanding and agreed to proceed.  Unable to perform video visit due to video visit attempted and failed and/or patient does not have video capability.   Some vital signs may be absent or patient reported.   Shawn Brace, LPN   Subjective:   Shawn Norris is a 65 y.o. male who presents for Medicare Annual/Subsequent preventive examination.  Review of Systems     Cardiac Risk Factors include: advanced age (>36mn, >>39women);hypertension;dyslipidemia;male gender     Objective:    There were no vitals filed for this visit. There is no height or weight on file to calculate BMI.  Advanced Directives 11/25/2020 08/28/2019 08/15/2018 06/25/2014  Does Patient Have a Medical Advance Directive? Yes Yes Yes Yes  Type of AAcademic librarianLiving will HArcherLiving will Living will  Does patient want to make changes to medical advance directive? - - No - Patient declined -  Copy of HAppomattoxin Chart? No - copy requested - No - copy requested No - copy requested    Current Medications (verified) Outpatient Encounter Medications as of 11/25/2020  Medication Sig   aspirin EC 81 MG tablet Take 81 mg by mouth daily.   citalopram (CELEXA) 20 MG tablet TAKE 1 TABLET(20 MG) BY MOUTH DAILY   fluticasone (FLONASE) 50 MCG/ACT nasal spray  SHAKE LIQUID AND USE 2 SPRAYS IN EACH NOSTRIL EVERY DAY   lisinopril-hydrochlorothiazide (ZESTORETIC) 10-12.5 MG tablet TAKE 1 TABLET BY MOUTH DAILY   LORazepam (ATIVAN) 1 MG tablet Take 1 tablet (1 mg total) by mouth every 8 (eight) hours as needed for anxiety.   simvastatin (ZOCOR) 40 MG tablet TAKE 1 TABLET(40 MG) BY MOUTH AT BEDTIME   verapamil (CALAN) 120 MG tablet TAKE 1 TABLET(120 MG) BY MOUTH TWICE DAILY   zolpidem (AMBIEN) 5 MG tablet TAKE 1 TABLET BY MOUTH EVERY NIGHT AS NEEDED FOR INSOMNIA   No facility-administered encounter medications on file as of 11/25/2020.    Allergies (verified) Penicillins   History: Past Medical History:  Diagnosis Date   Allergy    Anxiety    Arthritis    BPH (benign prostatic hypertrophy)    Crohn's    Dysuria    History of PSVT (paroxysmal supraventricular tachycardia)    Hypercholesterolemia    May 2013 - TC 152, TG 48, HDL 60, LDL 82   Hypertension    Inguinal hernia    Insomnia    Palpitations    Prostatitis    Varicocele    Past Surgical History:  Procedure Laterality Date   ACHILLES TENDON REPAIR     INGUINAL HERNIA REPAIR     Family History  Problem Relation Age of Onset   Dementia Mother    Social History   Socioeconomic History   Marital status: Single    Spouse name: Not on file   Number of children: Not on file   Years  of education: Not on file   Highest education level: Not on file  Occupational History   Occupation: Sales   Tobacco Use   Smoking status: Never   Smokeless tobacco: Never  Substance and Sexual Activity   Alcohol use: Yes    Alcohol/week: 4.0 standard drinks    Types: 4 Glasses of wine per week   Drug use: No   Sexual activity: Yes  Other Topics Concern   Not on file  Social History Narrative    He is a single healthy gentleman. He is an avid exerciser at the gym with treadmill, and other exercises routinely. He drinks social alcohol but otherwise is stable.    Social Determinants of  Health   Financial Resource Strain: Low Risk    Difficulty of Paying Living Expenses: Not hard at all  Food Insecurity: No Food Insecurity   Worried About Charity fundraiser in the Last Year: Never true   [place] in the Last Year: Never true  Transportation Needs: No Transportation Needs   Lack of Transportation (Medical): No   Lack of Transportation (Non-Medical): No  Physical Activity: Sufficiently Active   Days of Exercise per Week: 5 days   Minutes of Exercise per Session: 60 min  Stress: Stress Concern Present   Feeling of Stress : To some extent  Social Connections: Moderately Integrated   Frequency of Communication with Friends and Family: More than three times a week   Frequency of Social Gatherings with Friends and Family: More than three times a week   Attends Religious Services: Never   Marine scientist or Organizations: Yes   Attends Music therapist: 1 to 4 times per year   Marital Status: Living with partner    Tobacco Counseling Counseling given: Not Answered   Clinical Intake:  Pre-visit preparation completed: Yes  Pain : No/denies pain     BMI - recorded: 20.99 Nutritional Status: BMI of 19-24  Normal Diabetes: No  How often do you need to have someone help you when you read instructions, pamphlets, or other written materials from your doctor or pharmacy?: 1 - Never  Diabetic?No  Interpreter Needed?: No  Information entered by :: Charlott Rakes, LPN   Activities of Daily Living In your present state of health, do you have any difficulty performing the following activities: 11/25/2020  Hearing? Y  Comment slight loss  Vision? N  Difficulty concentrating or making decisions? N  Walking or climbing stairs? N  Dressing or bathing? N  Doing errands, shopping? N  Preparing Food and eating ? N  Using the Toilet? N  In the past six months, have you accidently leaked urine? N  Do you have problems with loss of bowel  control? N  Managing your Medications? N  Managing your Finances? N  Housekeeping or managing your Housekeeping? N  Some recent data might be hidden    Patient Care Team: Vivi Barrack, MD as PCP - General (Family Medicine) Irine Seal, MD as Consulting Physician (Urology) Carol Ada, MD as Consulting Physician (Gastroenterology)  Indicate any recent Medical Services you may have received from other than [facility] providers in the past year (date may be approximate).     Assessment:   This is a routine wellness examination for [place].  Hearing/Vision screen Hearing Screening - Comments:: Pt stated slight loss at times related to ringing at time  Vision Screening - Comments:: Pt follows up with provider for annual eye exams  Dietary issues and exercise activities discussed: Current Exercise Habits: Home exercise routine, Type of exercise: walking, Time (Minutes): 60, Frequency (Times/Week): 5, Weekly Exercise (Minutes/Week): 300   Goals Addressed             This Visit's Progress    Patient Stated       Stay healthy        Depression Screen PHQ 2/9 Scores 11/25/2020 07/08/2020 08/28/2019 08/15/2018 05/20/2012  PHQ - 2 Score 0 1 0 0 0  PHQ- 9 Score - 7 - - -    Fall Risk Fall Risk  11/25/2020 08/28/2019 08/15/2018  Falls in the past year? 0 0 0  Number falls in past yr: 0 0 -  Injury with Fall? 0 0 -  Risk for fall due to : - Other (Comment) -  Risk for fall due to: Comment - Pt has had cataract surgery -  Follow up Falls prevention discussed Falls prevention discussed -    FALL RISK PREVENTION PERTAINING TO THE HOME:  Any stairs in or around the home? Yes  If so, are there any without handrails? No  Home free of loose throw rugs in walkways, pet beds, electrical cords, etc? Yes  Adequate lighting in your home to reduce risk of falls? Yes   ASSISTIVE DEVICES UTILIZED TO PREVENT FALLS:  Life alert? No  Use of a cane, walker or w/c? No  Grab bars in the bathroom? No   Shower chair or bench in shower? No  Elevated toilet seat or a handicapped toilet? No   TIMED UP AND GO:  Was the test performed? No .   Cognitive Function:     6CIT Screen 11/25/2020 08/28/2019  What Year? 0 points 0 points  What month? 0 points 0 points  What time? 0 points 0 points  Count back from 20 0 points 0 points  Months in reverse 0 points 0 points  Repeat phrase 0 points 0 points  Total Score 0 0    Immunizations Immunization History  Administered Date(s) Administered   Fluad Quad(high Dose 65+) 10/08/2018   Influenza Inj Mdck Quad Pf 01/12/2018   Influenza Split 12/19/2010, 11/27/2011   Influenza,inj,Quad PF,6+ Mos 11/25/2012, 11/21/2013, 12/07/2014, 11/23/2016, 12/18/2019   PFIZER(Purple Top)SARS-COV-2 Vaccination 03/18/2019, 04/07/2019, 11/20/2019   Pneumococcal Conjugate-13 08/15/2018   Tdap 07/31/2008   Zoster, Live 12/19/2010    TDAP status: Due, Education has been provided regarding the importance of this vaccine. Advised may receive this vaccine at local pharmacy or Health Dept. Aware to provide a copy of the vaccination record if obtained from local pharmacy or Health Dept. Verbalized acceptance and understanding.  Flu Vaccine status: Due, Education has been provided regarding the importance of this vaccine. Advised may receive this vaccine at local pharmacy or Health Dept. Aware to provide a copy of the vaccination record if obtained from local pharmacy or Health Dept. Verbalized acceptance and understanding.  Pneumococcal vaccine status: Up to date  Covid-19 vaccine status: Completed vaccines  Qualifies for Shingles Vaccine? Yes   Zostavax completed Yes   Shingrix Completed?: No.    Education has been provided regarding the importance of this vaccine. Patient has been advised to call insurance company to determine out of pocket expense if they have not yet received this vaccine. Advised may also receive vaccine at local pharmacy or Health Dept.  Verbalized acceptance and understanding.  Screening Tests Health Maintenance  Topic Date Due   Zoster Vaccines- Shingrix (1 of 2) Never done  COVID-19 Vaccine (4 - Booster for Pfizer series) 02/12/2020   INFLUENZA VACCINE  09/20/2020   TETANUS/TDAP  07/08/2021 (Originally 08/01/2018)   COLONOSCOPY (Pts 45-31yr Insurance coverage will need to be confirmed)  07/29/2024   Hepatitis C Screening  Completed   HPV VACCINES  Aged Out    Health Maintenance  Health Maintenance Due  Topic Date Due   Zoster Vaccines- Shingrix (1 of 2) Never done   COVID-19 Vaccine (4 - Booster for Pfizer series) 02/12/2020   INFLUENZA VACCINE  09/20/2020    Colorectal cancer screening: Type of screening: Colonoscopy. Completed 07/30/14. Repeat every 10 years  Additional Screening:  Hepatitis C Screening:  Completed 07/10/16  Vision Screening: Recommended annual ophthalmology exams for early detection of glaucoma and other disorders of the eye. Is the patient up to date with their annual eye exam?  Yes  Who is the provider or what is the name of the office in which the patient attends annual eye exams? Provider on [address] If pt is not established with a provider, would they like to be referred to a provider to establish care? No .   Dental Screening: Recommended annual dental exams for proper oral hygiene  Community Resource Referral / Chronic Care Management: CRR required this visit?  No   CCM required this visit?  No      Plan:     I have personally reviewed and noted the following in the patient's chart:   Medical and social history Use of alcohol, tobacco or illicit drugs  Current medications and supplements including opioid prescriptions. Patient is not currently taking opioid prescriptions. Functional ability and status Nutritional status Physical activity Advanced directives List of other physicians Hospitalizations, surgeries, and ER visits in previous 12  months Vitals Screenings to include cognitive, depression, and falls Referrals and appointments  In addition, I have reviewed and discussed with patient certain preventive protocols, quality metrics, and best practice recommendations. A written personalized care plan for preventive services as well as general preventive health recommendations were provided to patient.     TWillette Brace LPN   131/[id number]  Nurse Notes: Pt stated that he is having trouble sleeping and waking up at 2 a.m nightly, he is becoming more anxious throughout the day . He is requesting to increase Lorazepam 1 mg more times a day as needed, Also increasing the dosage of Ambien please advise?

## 2020-11-29 ENCOUNTER — Telehealth: Payer: Self-pay

## 2020-11-29 NOTE — Telephone Encounter (Signed)
See below

## 2020-11-29 NOTE — Telephone Encounter (Signed)
I sent the encounter to [name] last week.He needs an office visit to discuss.  Shawn Norris. Shawn Pain, MD 11/29/2020 3:56 PM

## 2020-11-29 NOTE — Telephone Encounter (Signed)
Pt called regarding his Ativan and Ambien. Shawn Norris stated that he did a visit with Otila Kluver last week on 10/6 and he stated that she talked about upping the dosages of the Ativan and Ambien. Shawn Norris then stated that he has not received any information on the dosages. Shawn Norris would like a call back. Please Advise.

## 2020-11-30 NOTE — Telephone Encounter (Signed)
LVM for pt to call the office back for an appt.

## 2020-12-06 ENCOUNTER — Other Ambulatory Visit: Payer: Self-pay

## 2020-12-06 ENCOUNTER — Ambulatory Visit (INDEPENDENT_AMBULATORY_CARE_PROVIDER_SITE_OTHER): Payer: Medicare Other | Admitting: Family Medicine

## 2020-12-06 VITALS — BP 99/63 | HR 83 | Temp 98.2°F | Ht 66.0 in | Wt 128.2 lb

## 2020-12-06 DIAGNOSIS — F410 Panic disorder [episodic paroxysmal anxiety] without agoraphobia: Secondary | ICD-10-CM | POA: Diagnosis not present

## 2020-12-06 DIAGNOSIS — F341 Dysthymic disorder: Secondary | ICD-10-CM | POA: Diagnosis not present

## 2020-12-06 DIAGNOSIS — Z23 Encounter for immunization: Secondary | ICD-10-CM | POA: Diagnosis not present

## 2020-12-06 DIAGNOSIS — G4700 Insomnia, unspecified: Secondary | ICD-10-CM

## 2020-12-06 DIAGNOSIS — K50019 Crohn's disease of small intestine with unspecified complications: Secondary | ICD-10-CM

## 2020-12-06 MED ORDER — ZOLPIDEM TARTRATE 10 MG PO TABS: 10.0000 mg | ORAL_TABLET | Freq: Every evening | ORAL | 0 refills | Status: DC | PRN

## 2020-12-06 MED ORDER — LORAZEPAM 1 MG PO TABS: 1.0000 mg | ORAL_TABLET | Freq: Three times a day (TID) | ORAL | 5 refills | Status: DC | PRN

## 2020-12-06 NOTE — Patient Instructions (Signed)
It was very nice to see you today!  We will increase your ambien to 24m nightly as needed.   We will get your flu shot today.  No other changes today.  Send me a message on MyChart in a few weeks to let me know how the medication changes are working.  Take care, Dr PJerline Pain PLEASE NOTE:  If you had any lab tests please let [place] if you have not heard back within a few days. You may see your results on mychart before we have a chance to review them but we will give you a call once they are reviewed by [place] If we ordered any referrals today, please let [place] if you have not heard from their office within the next week.   Please try these tips to maintain a healthy lifestyle:  Eat at least 3 REAL meals and 1-2 snacks per day.  Aim for no more than 5 hours between eating.  If you eat breakfast, please do so within one hour of getting up.   Each meal should contain half fruits/vegetables, one quarter protein, and one quarter carbs (no bigger than a computer mouse)  Cut down on sweet beverages. This includes juice, soda, and sweet tea.   Drink at least 1 glass of water with each meal and aim for at least 8 glasses per day  Exercise at least 150 minutes every week.

## 2020-12-06 NOTE — Progress Notes (Signed)
   Shawn Norris is a 65 y.o. male who presents today for an office visit.  Assessment/Plan:  Chronic Problems Addressed Today: Dysthymia He stopped Celexa.  Did not feel like it was effective.  He is taking Ativan 2-3 times daily which seems to help with his underlying anxiety.  We will be treating his insomnia as below.  He will check with me in a couple weeks via MyChart.  Crohn's disease Follows with GI. Had a recent flare that was mild.   Insomnia Not controlled. We will increase ambien to 80m nightly and he will check in with me in a few weeks on mychart.   Panic attacks Continue ativan 2-3 times per day as needed. Did not have any improvement on buspar or celexa. May consider trial of alternative SSRI in the future.   Flu shot given today.     Subjective:  HPI:  He is interested in an increased dosage of anxiety medication. He takes Ambien once at night, and Ativan 3x in a day. Additionally he states he takes melatonin. The celexa medication he states did not work.He notes waking up in the night and having to take more medication to return to sleep.       Objective:  Physical Exam: BP 99/63   Pulse 83   Temp 98.2 F (36.8 C)   Ht 5' 6"  (1.676 m)   Wt 128 lb 4 oz (58.2 kg)   SpO2 98%   BMI 20.70 kg/m   Gen: No acute distress, resting comfortably Neuro: Grossly normal, moves all extremities Psych: Normal affect and thought content      I,Jordan Kelly,acting as a scribe for CDimas Chyle MD.,have documented all relevant documentation on the behalf of CDimas Chyle MD,as directed by  CDimas Chyle MD while in the presence of CDimas Chyle MD.  I, CDimas Chyle MD, have reviewed all documentation for this visit. The documentation on 12/06/20 for the exam, diagnosis, procedures, and orders are all accurate and complete.  CAlgis Greenhouse PJerline Pain MD 12/06/2020 10:31 AM

## 2020-12-06 NOTE — Assessment & Plan Note (Signed)
Not controlled. We will increase ambien to 2m nightly and he will check in with me in a few weeks on mychart.

## 2020-12-06 NOTE — Assessment & Plan Note (Signed)
Continue ativan 2-3 times per day as needed. Did not have any improvement on buspar or celexa. May consider trial of alternative SSRI in the future.

## 2020-12-06 NOTE — Assessment & Plan Note (Signed)
He stopped Celexa.  Did not feel like it was effective.  He is taking Ativan 2-3 times daily which seems to help with his underlying anxiety.  We will be treating his insomnia as below.  He will check with me in a couple weeks via MyChart.

## 2020-12-06 NOTE — Assessment & Plan Note (Signed)
Follows with GI. Had a recent flare that was mild.

## 2020-12-09 ENCOUNTER — Other Ambulatory Visit: Payer: Self-pay | Admitting: Family Medicine

## 2020-12-09 DIAGNOSIS — E7800 Pure hypercholesterolemia, unspecified: Secondary | ICD-10-CM

## 2021-01-03 ENCOUNTER — Other Ambulatory Visit: Payer: Self-pay | Admitting: Family Medicine

## 2021-01-21 DIAGNOSIS — H35372 Puckering of macula, left eye: Secondary | ICD-10-CM | POA: Diagnosis not present

## 2021-02-22 ENCOUNTER — Telehealth: Payer: Self-pay | Admitting: Family Medicine

## 2021-02-22 NOTE — Telephone Encounter (Signed)
Pt is going out of town and needs a refill by Friday, 02/25/21.   zolpidem (AMBIEN) 10 MG tablet  [facility] [place], [address] AT [address]

## 2021-03-07 ENCOUNTER — Other Ambulatory Visit: Payer: Self-pay | Admitting: Family Medicine

## 2021-03-07 DIAGNOSIS — E7800 Pure hypercholesterolemia, unspecified: Secondary | ICD-10-CM

## 2021-06-03 ENCOUNTER — Other Ambulatory Visit: Payer: Self-pay | Admitting: Family Medicine

## 2021-06-03 DIAGNOSIS — E7800 Pure hypercholesterolemia, unspecified: Secondary | ICD-10-CM

## 2021-06-19 ENCOUNTER — Other Ambulatory Visit: Payer: Self-pay | Admitting: Family Medicine

## 2021-06-20 NOTE — Telephone Encounter (Signed)
Last visit: 12/06/20 ? ?Next visit: 12/08/21 ? ?Last filled: 01/03/21 ? ?Quantity: 30 w/ 5 refills  ?

## 2021-07-20 ENCOUNTER — Other Ambulatory Visit: Payer: Self-pay | Admitting: Family Medicine

## 2021-07-20 NOTE — Telephone Encounter (Signed)
   LAST APPOINTMENT DATE:   11/20/2020  NEXT APPOINTMENT DATE: 08/15/2021 CPE  MEDICATION: LORazepam (ATIVAN) 1 MG tablet [[id number]]    Is the patient out of medication?  Has 10 pills left  PHARMACY:  [facility] [place], [address], [place] [address]  Phone:  [phone]  Fax:  [phone]

## 2021-07-21 MED ORDER — LORAZEPAM 1 MG PO TABS
1.0000 mg | ORAL_TABLET | Freq: Three times a day (TID) | ORAL | 5 refills | Status: DC | PRN
Start: 2021-07-21 — End: 2022-02-09

## 2021-08-18 ENCOUNTER — Encounter: Payer: Medicare Other | Admitting: Family Medicine

## 2021-09-06 ENCOUNTER — Ambulatory Visit (INDEPENDENT_AMBULATORY_CARE_PROVIDER_SITE_OTHER): Payer: Medicare Other | Admitting: Family Medicine

## 2021-09-06 ENCOUNTER — Encounter: Payer: Self-pay | Admitting: Family Medicine

## 2021-09-06 VITALS — BP 119/76 | HR 76 | Temp 98.2°F | Ht 66.0 in | Wt 133.6 lb

## 2021-09-06 DIAGNOSIS — E785 Hyperlipidemia, unspecified: Secondary | ICD-10-CM | POA: Diagnosis not present

## 2021-09-06 DIAGNOSIS — F341 Dysthymic disorder: Secondary | ICD-10-CM

## 2021-09-06 DIAGNOSIS — F410 Panic disorder [episodic paroxysmal anxiety] without agoraphobia: Secondary | ICD-10-CM | POA: Diagnosis not present

## 2021-09-06 DIAGNOSIS — R739 Hyperglycemia, unspecified: Secondary | ICD-10-CM | POA: Diagnosis not present

## 2021-09-06 DIAGNOSIS — I10 Essential (primary) hypertension: Secondary | ICD-10-CM | POA: Diagnosis not present

## 2021-09-06 DIAGNOSIS — Z0001 Encounter for general adult medical examination with abnormal findings: Secondary | ICD-10-CM

## 2021-09-06 DIAGNOSIS — N400 Enlarged prostate without lower urinary tract symptoms: Secondary | ICD-10-CM | POA: Diagnosis not present

## 2021-09-06 DIAGNOSIS — K50019 Crohn's disease of small intestine with unspecified complications: Secondary | ICD-10-CM

## 2021-09-06 DIAGNOSIS — G4700 Insomnia, unspecified: Secondary | ICD-10-CM

## 2021-09-06 DIAGNOSIS — Z23 Encounter for immunization: Secondary | ICD-10-CM

## 2021-09-06 LAB — CBC
HCT: 42.2 % (ref 39.0–52.0)
Hemoglobin: 14.3 g/dL (ref 13.0–17.0)
MCHC: 33.8 g/dL (ref 30.0–36.0)
MCV: 98.5 fl (ref 78.0–100.0)
Platelets: 228 10*3/uL (ref 150.0–400.0)
RBC: 4.28 Mil/uL (ref 4.22–5.81)
RDW: 13.4 % (ref 11.5–15.5)
WBC: 5 10*3/uL (ref 4.0–10.5)

## 2021-09-06 LAB — URINALYSIS, ROUTINE W REFLEX MICROSCOPIC
Bilirubin Urine: NEGATIVE
Hgb urine dipstick: NEGATIVE
Leukocytes,Ua: NEGATIVE
Nitrite: NEGATIVE
Specific Gravity, Urine: 1.025 (ref 1.000–1.030)
Total Protein, Urine: NEGATIVE
Urine Glucose: NEGATIVE
Urobilinogen, UA: 0.2 (ref 0.0–1.0)
pH: 6 (ref 5.0–8.0)

## 2021-09-06 LAB — HEMOGLOBIN A1C: Hgb A1c MFr Bld: 5.4 % (ref 4.6–6.5)

## 2021-09-06 MED ORDER — ESZOPICLONE 3 MG PO TABS: 3.0000 mg | ORAL_TABLET | Freq: Every day | ORAL | 0 refills | Status: DC

## 2021-09-06 NOTE — Addendum Note (Signed)
Addended by: Betti Cruz on: 09/06/2021 01:44 PM   Modules accepted: Orders

## 2021-09-06 NOTE — Assessment & Plan Note (Signed)
At goal today on lisinopril-HCTZ 10-12.76m once daily and verapamil 120 mg twice daily.

## 2021-09-06 NOTE — Assessment & Plan Note (Addendum)
Symptoms are still persistent but manageable. We dicussed medication management options today.  He does not wish to try any medications.  We have tried Celexa in the past which did not work well for him.  We also discussed referral for therapy however he declined for now.  We will continue Ativan 2-3 times daily as needed for anxiety.  We discussed reasons to return to care.  We will be treating his insomnia as below.  We will follow-up in a few weeks via MyChart.  He will let me know if he changes his mind about medications or referral to see a therapist.

## 2021-09-06 NOTE — Assessment & Plan Note (Signed)
Symptoms are still not controlled.  He has not had much improvement with Ambien.  We will switch to [facility].  Follow-up in a few weeks via MyChart.  We discussed potential side effects.

## 2021-09-06 NOTE — Assessment & Plan Note (Signed)
Check lipids.  He is on simvastatin 40 mg daily.

## 2021-09-06 NOTE — Assessment & Plan Note (Signed)
We discussed switching medications.  He is currently on Ativan 2-3 times a day per needed which works reasonably well.  He did not have any improvement with BuSpar Celexa in the past.  We discussed trial of alternative SSRI have referral to therapy however he deferred.

## 2021-09-06 NOTE — Assessment & Plan Note (Signed)
Check PSA and urinalysis.  Symptoms are stable without medications.

## 2021-09-06 NOTE — Assessment & Plan Note (Signed)
Continue management per GI.  He has only azathioprine.

## 2021-09-06 NOTE — Patient Instructions (Signed)
It was very nice to see you today!  Please try the Lunesta.  Stop the Ambien.  Sending message in a few weeks on how this is working.  You can try taking Ativan 3 times daily as needed.  We will check blood work today.  We gave you a pneumonia vaccine today.  I will see back in year for your next physical.  Please come back to see me sooner if needed.  Take care, Dr [name]  PLEASE NOTE:  If you had any lab tests please let us know if you have not heard back within a few days. You may see your results on mychart before we have a chance to review them but we will give you a call once they are reviewed by [place]. If we ordered any referrals today, please let us know if you have not heard from their office within the next week.   Please try these tips to maintain a healthy lifestyle:  Eat at least 3 REAL meals and 1-2 snacks per day.  Aim for no more than 5 hours between eating.  If you eat breakfast, please do so within one hour of getting up.   Each meal should contain half fruits/vegetables, one quarter protein, and one quarter carbs (no bigger than a computer mouse)  Cut down on sweet beverages. This includes juice, soda, and sweet tea.   Drink at least 1 glass of water with each meal and aim for at least 8 glasses per day  Exercise at least 150 minutes every week.    Preventive Care 59 Years and Older, Male Preventive care refers to lifestyle choices and visits with your health care provider that can promote health and wellness. Preventive care visits are also called wellness exams. What can I expect for my preventive care visit? Counseling During your preventive care visit, your health care provider may ask about your: Medical history, including: Past medical problems. Family medical history. History of falls. Current health, including: Emotional well-being. Home life and relationship well-being. Sexual activity. Memory and ability to understand (cognition). Lifestyle,  including: Alcohol, nicotine or tobacco, and drug use. Access to firearms. Diet, exercise, and sleep habits. Work and work Statistician. Sunscreen use. Safety issues such as seatbelt and bike helmet use. Physical exam Your health care provider will check your: Height and weight. These may be used to calculate your BMI (body mass index). BMI is a measurement that tells if you are at a healthy weight. Waist circumference. This measures the distance around your waistline. This measurement also tells if you are at a healthy weight and may help predict your risk of certain diseases, such as type 2 diabetes and high blood pressure. Heart rate and blood pressure. Body temperature. Skin for abnormal spots. What immunizations do I need?  Vaccines are usually given at various ages, according to a schedule. Your health care provider will recommend vaccines for you based on your age, medical history, and lifestyle or other factors, such as travel or where you work. What tests do I need? Screening Your health care provider may recommend screening tests for certain conditions. This may include: Lipid and cholesterol levels. Diabetes screening. This is done by checking your blood sugar (glucose) after you have not eaten for a while (fasting). Hepatitis C test. Hepatitis B test. HIV (human immunodeficiency virus) test. STI (sexually transmitted infection) testing, if you are at risk. Lung cancer screening. Colorectal cancer screening. Prostate cancer screening. Abdominal aortic aneurysm (AAA) screening. You may need this  if you are a current or former smoker. Talk with your health care provider about your test results, treatment options, and if necessary, the need for more tests. Follow these instructions at home: Eating and drinking  Eat a diet that includes fresh fruits and vegetables, whole grains, lean protein, and low-fat dairy products. Limit your intake of foods with high amounts of sugar,  saturated fats, and salt. Take vitamin and mineral supplements as recommended by your health care provider. Do not drink alcohol if your health care provider tells you not to drink. If you drink alcohol: Limit how much you have to 0-2 drinks a day. Know how much alcohol is in your drink. In the [place] one drink equals one 12 oz bottle of beer (355 mL), one 5 oz glass of wine (148 mL), or one 1 oz glass of hard liquor (44 mL). Lifestyle Brush your teeth every morning and night with fluoride toothpaste. Floss one time each day. Exercise for at least 30 minutes 5 or more days each week. Do not use any products that contain nicotine or tobacco. These products include cigarettes, chewing tobacco, and vaping devices, such as e-cigarettes. If you need help quitting, ask your health care provider. Do not use drugs. If you are sexually active, practice safe sex. Use a condom or other form of protection to prevent STIs. Take aspirin only as told by your health care provider. Make sure that you understand how much to take and what form to take. Work with your health care provider to find out whether it is safe and beneficial for you to take aspirin daily. Ask your health care provider if you need to take a cholesterol-lowering medicine (statin). Find healthy ways to manage stress, such as: Meditation, yoga, or listening to music. Journaling. Talking to a trusted person. Spending time with friends and family. Safety Always wear your seat belt while driving or riding in a vehicle. Do not drive: If you have been drinking alcohol. Do not ride with someone who has been drinking. When you are tired or distracted. While texting. If you have been using any mind-altering substances or drugs. Wear a helmet and other protective equipment during sports activities. If you have firearms in your house, make sure you follow all gun safety procedures. Minimize exposure to UV radiation to reduce your risk of skin  cancer. What's next? Visit your health care provider once a year for an annual wellness visit. Ask your health care provider how often you should have your eyes and teeth checked. Stay up to date on all vaccines. This information is not intended to replace advice given to you by your health care provider. Make sure you discuss any questions you have with your health care provider. Document Revised: 08/04/2020 Document Reviewed: 08/04/2020 Elsevier Patient Education  [place].

## 2021-09-06 NOTE — Progress Notes (Signed)
Chief Complaint:  Shawn Norris is a 66 y.o. male who presents today for his annual comprehensive physical exam.    Assessment/Plan:  Chronic Problems Addressed Today: Dysthymia Symptoms are still persistent but manageable. We dicussed medication management options today.  He does not wish to try any medications.  We have tried Celexa in the past which did not work well for him.  We also discussed referral for therapy however he declined for now.  We will continue Ativan 2-3 times daily as needed for anxiety.  We discussed reasons to return to care.  We will be treating his insomnia as below.  We will follow-up in a few weeks via MyChart.  He will let me know if he changes his mind about medications or referral to see a therapist.  Crohn's disease Continue management per GI.  He has only azathioprine.  Hyperlipidemia LDL goal <100 Check lipids.  He is on simvastatin 40 mg daily.  Hypertension At goal today on lisinopril-HCTZ 10-12.71m once daily and verapamil 120 mg twice daily.  BPH (benign prostatic hyperplasia) Check PSA and urinalysis.  Symptoms are stable without medications.  Insomnia Symptoms are still not controlled.  He has not had much improvement with Ambien.  [facility]  Follow-up in a few weeks via MyChart.  We discussed potential side effects.  Panic attacks We discussed switching medications.  He is currently on Ativan 2-3 times a day per needed which works reasonably well.  He did not have any improvement with BuSpar Celexa in the past.  We discussed trial of alternative SSRI have referral to therapy however he deferred.   Preventative Healthcare: Pneumovax given today. Check labs.   Patient Counseling(The following topics were reviewed and/or handout was given):  -Nutrition: Stressed importance of moderation in sodium/caffeine intake, saturated fat and cholesterol, caloric balance, sufficient intake of fresh fruits, vegetables, and fiber.   -Stressed the importance of regular exercise.   -Substance Abuse: Discussed cessation/primary prevention of tobacco, alcohol, or other drug use; driving or other dangerous activities under the influence; availability of treatment for abuse.   -Injury prevention: Discussed safety belts, safety helmets, smoke detector, smoking near bedding or upholstery.   -Sexuality: Discussed sexually transmitted diseases, partner selection, use of condoms, avoidance of unintended pregnancy and contraceptive alternatives.   -Dental health: Discussed importance of regular tooth brushing, flossing, and dental visits.  -Health maintenance and immunizations reviewed. Please refer to Health maintenance section.  Return to care in 1 year for next preventative visit.     Subjective:  HPI:  He has no acute complaints today. See A/p for status of chronic conditions.   Lifestyle Diet: Balanced. Plenty of fruits and vegetables.  Exercise: Walks daily.      11/25/2020    8:57 AM  Depression screen PHQ 2/9  Decreased Interest 0  Down, Depressed, Hopeless 0  PHQ - 2 Score 0   Health Maintenance Due  Topic Date Due   Zoster Vaccines- Shingrix (1 of 2) Never done   TETANUS/TDAP  08/01/2018   Pneumonia Vaccine 68 Years old (2 - PPSV23 or PCV20) 08/15/2019   COVID-19 Vaccine (4 - Booster for Pfizer series) 01/15/2020     ROS: Per HPI, otherwise a complete review of systems was negative.   PMH:  The following were reviewed and entered/updated in epic: Past Medical History:  Diagnosis Date   Allergy    Anxiety    Arthritis    BPH (benign prostatic hypertrophy)  Crohn's    Dysuria    History of PSVT (paroxysmal supraventricular tachycardia)    Hypercholesterolemia    May 2013 - TC 152, TG 48, HDL 60, LDL 82   Hypertension    Inguinal hernia    Insomnia    Palpitations    Prostatitis    Varicocele    Patient Active Problem List   Diagnosis Date Noted   Actinic keratosis 08/18/2019   BPH  (benign prostatic hyperplasia) 05/14/2019   Panic attacks 01/28/2019   Insomnia 01/28/2019   PSVT (paroxysmal supraventricular tachycardia) ([place]) 08/15/2018   Allergic rhinitis 06/17/2013   Dysthymia 06/17/2013   Actinic keratosis of scalp 01/08/2013   Hypertension 07/03/2011   Hyperlipidemia LDL goal <100 07/03/2011   Crohn's disease ([place]) 07/03/2011   Past Surgical History:  Procedure Laterality Date   ACHILLES TENDON REPAIR     INGUINAL HERNIA REPAIR      Family History  Problem Relation Age of Onset   Dementia Mother     Medications- reviewed and updated Current Outpatient Medications  Medication Sig Dispense Refill   aspirin EC 81 MG tablet Take 81 mg by mouth daily.     Eszopiclone 3 MG TABS Take 1 tablet (3 mg total) by mouth at bedtime. Take immediately before bedtime 30 tablet 0   lisinopril-hydrochlorothiazide (ZESTORETIC) 10-12.5 MG tablet TAKE 1 TABLET BY MOUTH DAILY 90 tablet 3   LORazepam (ATIVAN) 1 MG tablet Take 1 tablet (1 mg total) by mouth every 8 (eight) hours as needed for anxiety. 90 tablet 5   simvastatin (ZOCOR) 40 MG tablet TAKE 1 TABLET(40 MG) BY MOUTH AT BEDTIME 90 tablet 0   verapamil (CALAN) 120 MG tablet TAKE 1 TABLET(120 MG) BY MOUTH TWICE DAILY 180 tablet 3   azaTHIOprine (IMURAN) 50 MG tablet Take 100 mg by mouth daily.     fluticasone (FLONASE) 50 MCG/ACT nasal spray SHAKE LIQUID AND USE 2 SPRAYS IN EACH NOSTRIL EVERY DAY (Patient not taking: Reported on 09/06/2021) 48 g 1   No current facility-administered medications for this visit.    Allergies-reviewed and updated Allergies  Allergen Reactions   Penicillins Rash    Social History   Socioeconomic History   Marital status: Single    Spouse name: Not on file   Number of children: Not on file   Years of education: Not on file   Highest education level: Not on file  Occupational History   Occupation: Sales   Tobacco Use   Smoking status: Never   Smokeless tobacco: Never   Substance and Sexual Activity   Alcohol use: Yes    Alcohol/week: 4.0 standard drinks of alcohol    Types: 4 Glasses of wine per week   Drug use: No   Sexual activity: Yes  Other Topics Concern   Not on file  Social History Narrative    He is a single healthy gentleman. He is an avid exerciser at the gym with treadmill, and other exercises routinely. He drinks social alcohol but otherwise is stable.    Social Determinants of Health   Financial Resource Strain: Low Risk  (11/25/2020)   Overall Financial Resource Strain (CARDIA)    Difficulty of Paying Living Expenses: Not hard at all  Food Insecurity: No Food Insecurity (11/25/2020)   Hunger Vital Sign    Worried About Running Out of Food in the Last Year: Never true    Ran Out of Food in the Last Year: Never true  Transportation Needs: No Transportation Needs (11/25/2020)  PRAPARE - Hydrologist (Medical): No    Lack of Transportation (Non-Medical): No  Physical Activity: Sufficiently Active (11/25/2020)   Exercise Vital Sign    Days of Exercise per Week: 5 days    Minutes of Exercise per Session: 60 min  Stress: Stress Concern Present (11/25/2020)   [place]    Feeling of Stress : To some extent  Social Connections: Moderately Integrated (11/25/2020)   Social Connection and Isolation Panel [NHANES]    Frequency of Communication with Friends and Family: More than three times a week    Frequency of Social Gatherings with Friends and Family: More than three times a week    Attends Religious Services: Never    Marine scientist or Organizations: Yes    Attends Archivist Meetings: 1 to 4 times per year    Marital Status: Living with partner        Objective:  Physical Exam: BP 119/76   Pulse 76   Temp 98.2 F (36.8 C) (Temporal)   Ht 5' 6"  (1.676 m)   Wt 133 lb 9.6 oz (60.6 kg)   SpO2 99%   BMI 21.56 kg/m    Body mass index is 21.56 kg/m. Wt Readings from Last 3 Encounters:  09/06/21 133 lb 9.6 oz (60.6 kg)  12/06/20 128 lb 4 oz (58.2 kg)  07/08/20 130 lb (59 kg)   Gen: NAD, resting comfortably HEENT: TMs normal bilaterally. OP clear. No thyromegaly noted.  CV: RRR with no murmurs appreciated Pulm: NWOB, CTAB with no crackles, wheezes, or rhonchi GI: Normal bowel sounds present. Soft, Nontender, Nondistended. MSK: no edema, cyanosis, or clubbing noted Skin: warm, dry Neuro: CN2-12 grossly intact. Strength 5/5 in upper and lower extremities. Reflexes symmetric and intact bilaterally.  Psych: Normal affect and thought content     Shawn Dick M. [name], MD 09/06/2021 1:39 PM

## 2021-09-07 ENCOUNTER — Other Ambulatory Visit: Payer: Self-pay | Admitting: *Deleted

## 2021-09-07 DIAGNOSIS — Z8679 Personal history of other diseases of the circulatory system: Secondary | ICD-10-CM

## 2021-09-07 DIAGNOSIS — I10 Essential (primary) hypertension: Secondary | ICD-10-CM

## 2021-09-07 DIAGNOSIS — E7800 Pure hypercholesterolemia, unspecified: Secondary | ICD-10-CM

## 2021-09-07 LAB — COMPREHENSIVE METABOLIC PANEL
ALT: 12 U/L (ref 0–53)
AST: 18 U/L (ref 0–37)
Albumin: 4.5 g/dL (ref 3.5–5.2)
Alkaline Phosphatase: 37 U/L — ABNORMAL LOW (ref 39–117)
BUN: 21 mg/dL (ref 6–23)
CO2: 28 mEq/L (ref 19–32)
Calcium: 9.7 mg/dL (ref 8.4–10.5)
Chloride: 103 mEq/L (ref 96–112)
Creatinine, Ser: 1.04 mg/dL (ref 0.40–1.50)
GFR: 73.92 mL/min (ref >60.00)
Glucose, Bld: 92 mg/dL (ref 70–99)
Potassium: 4.9 mEq/L (ref 3.5–5.1)
Sodium: 141 mEq/L (ref 135–145)
Total Bilirubin: 0.6 mg/dL (ref 0.2–1.2)
Total Protein: 7.2 g/dL (ref 6.0–8.3)

## 2021-09-07 LAB — LIPID PANEL
Cholesterol: 155 mg/dL (ref 0–200)
HDL: 64.6 mg/dL (ref >39.00)
LDL Cholesterol: 73 mg/dL (ref 0–99)
NonHDL: 90.64
Total CHOL/HDL Ratio: 2
Triglycerides: 88 mg/dL (ref 0.0–149.0)
VLDL: 17.6 mg/dL (ref 0.0–40.0)

## 2021-09-07 LAB — TSH: TSH: 1.59 u[IU]/mL (ref 0.35–5.50)

## 2021-09-07 LAB — PSA: PSA: 1.17 ng/mL (ref 0.10–4.00)

## 2021-09-07 MED ORDER — SIMVASTATIN 40 MG PO TABS: ORAL_TABLET | ORAL | 0 refills | Status: DC

## 2021-09-07 MED ORDER — VERAPAMIL HCL 120 MG PO TABS: ORAL_TABLET | ORAL | 3 refills | Status: DC

## 2021-09-09 NOTE — Progress Notes (Signed)
Please inform patient of the following:  Labs are all stable.  Do not need to make any changes to treatment plan at this time.  He should keep up the good work and we can recheck in a year.

## 2021-09-14 ENCOUNTER — Telehealth: Payer: Self-pay | Admitting: *Deleted

## 2021-09-14 ENCOUNTER — Other Ambulatory Visit: Payer: Self-pay | Admitting: *Deleted

## 2021-09-14 MED ORDER — ESZOPICLONE 3 MG PO TABS: 3.0000 mg | ORAL_TABLET | Freq: Every day | ORAL | 0 refills | Status: DC

## 2021-09-14 NOTE — Telephone Encounter (Signed)
Tiffany with [facility] Medicare has called in stating that medication was denied.  Will fax over denial outlining the reason.

## 2021-09-14 NOTE — Telephone Encounter (Signed)
Spoke with patient stated medication was not ready at [place] instead of medication PCP send   Spoke with pharmacy stated Rx Eszopiclone need PA, PA was send  Waiting for determination

## 2021-09-15 NOTE — Telephone Encounter (Signed)
Dr. [name], Rx for Eszopiclone 3 mg was denied by insurance. Please send Ambien instead for pt.

## 2021-09-16 NOTE — Telephone Encounter (Signed)
Can we check with patient? He did not have a good response with the [place]. Is there a reason the PA was denied?  I am find sending the ambien back in if he wishes but want to make sure he is ok with that first.  Shawn Norris. [name], MD 09/16/2021 8:14 AM

## 2021-09-19 NOTE — Telephone Encounter (Signed)
PA was send for review today waiting for determination

## 2021-09-26 ENCOUNTER — Telehealth: Payer: Self-pay | Admitting: Family Medicine

## 2021-09-26 NOTE — Telephone Encounter (Signed)
Pt states Eszopiclone has been denied by his insurance. He wants to know if there are any other alternatives. Please advise

## 2021-09-27 NOTE — Telephone Encounter (Signed)
Patient requests to be called at ph# [phone] for status of new sleeping medication issue (see telephone note 09/26/21)

## 2021-09-28 NOTE — Telephone Encounter (Signed)
Patient requests to be called at ph# [phone] regarding previous messages. Patient states he wants to know what to do about the sleep medication issue.Patient states he is low on sleep medication.

## 2021-09-29 NOTE — Telephone Encounter (Signed)
Spoke with patient, patient notified no determination received from insurance  Will call insurance tomorrow for more information

## 2021-09-29 NOTE — Telephone Encounter (Signed)
Dr. [name], Eszopiclone 3 mg has been denied by insurance. Pt would like something else for sleep. Please advise.

## 2021-09-30 NOTE — Telephone Encounter (Signed)
Non-Formulary exception form faxed to [phone]

## 2021-10-03 DIAGNOSIS — K5090 Crohn's disease, unspecified, without complications: Secondary | ICD-10-CM | POA: Diagnosis not present

## 2021-10-04 NOTE — Telephone Encounter (Signed)
Patient states he is almost out of sleeping medication (5 tablets left)  and thinks it is best to just stay on Ambien due to the difficulty of getting sleeping aid RX filled.   Patient requests a new RX for Ambien to be sent to:   [facility], [place] - [address] Phone:  [phone]  Fax:  [phone]

## 2021-10-06 NOTE — Telephone Encounter (Signed)
Left message to return call to our office at their convenience.   PA Rx Eszopiclone denied patient need to try Rx Doxepin, Ramelteon, temazepam and Belsomra Please advise

## 2021-10-06 NOTE — Telephone Encounter (Signed)
Please let him know insurnce will not pay for ambien. He can pay for this out of pocket or we can try sending in an alternative.  Algis Greenhouse. [name], MD 10/06/2021 12:33 PM

## 2021-10-08 NOTE — Telephone Encounter (Signed)
Left message to return call to our office at their convenience.  

## 2021-10-11 ENCOUNTER — Other Ambulatory Visit: Payer: Self-pay | Admitting: *Deleted

## 2021-10-11 MED ORDER — DOXEPIN HCL 6 MG PO TABS: 6.0000 mg | ORAL_TABLET | Freq: Every evening | ORAL | 0 refills | Status: DC

## 2021-10-11 NOTE — Telephone Encounter (Signed)
Patient returned call. Patient states he is going to go forward with Ambien.  Patient states he has never heard anything back from office regarding other sleep aid options.  Patient states he only has 1/2 of an Ambien for tonight.  Patient requests to be called at ph# [phone].

## 2021-10-12 NOTE — Telephone Encounter (Signed)
Spoke with patient, medication was send to pharmacy

## 2021-11-14 ENCOUNTER — Encounter: Payer: Self-pay | Admitting: *Deleted

## 2021-12-04 ENCOUNTER — Other Ambulatory Visit: Payer: Self-pay | Admitting: Family Medicine

## 2021-12-04 DIAGNOSIS — E7800 Pure hypercholesterolemia, unspecified: Secondary | ICD-10-CM

## 2021-12-05 ENCOUNTER — Telehealth: Payer: Self-pay | Admitting: Family Medicine

## 2021-12-05 NOTE — Telephone Encounter (Signed)
Patient states: - He does not want to take the Doxepin anymore and would like to restart the ambien for sleep  - He wanted to make sure this is known as soon as possible since he is due for a refill next week   I offered pt an OV to discuss further but he declined. States he would only have an OV if necessary.

## 2021-12-07 MED ORDER — ZOLPIDEM TARTRATE 10 MG PO TABS: 10.0000 mg | ORAL_TABLET | Freq: Every evening | ORAL | 5 refills | Status: DC | PRN

## 2021-12-07 NOTE — Telephone Encounter (Signed)
Patient is requesting generic ambien 10 mg - that is covered by insurance

## 2021-12-07 NOTE — Telephone Encounter (Signed)
Please advise if Lorrin Mais can be sent

## 2021-12-07 NOTE — Telephone Encounter (Signed)
It is ok to go back to [place] but per the last phone note his insurance denied his PA.  Algis Greenhouse. [name], MD 12/07/2021 7:33 AM

## 2021-12-08 ENCOUNTER — Ambulatory Visit (INDEPENDENT_AMBULATORY_CARE_PROVIDER_SITE_OTHER): Payer: Medicare Other

## 2021-12-08 DIAGNOSIS — Z0000 Encounter for general adult medical examination without abnormal findings: Secondary | ICD-10-CM

## 2021-12-08 NOTE — Progress Notes (Signed)
I connected with  Shawn Norris on 12/08/21 by a audio enabled telemedicine application and verified that I am speaking with the correct person using two identifiers.  Patient Location: Home  Provider Location: Office/Clinic  I discussed the limitations of evaluation and management by telemedicine. The patient expressed understanding and agreed to proceed.   Subjective:   Shawn Norris is a 66 y.o. male who presents for Medicare Annual/Subsequent preventive examination.  Review of Systems     Cardiac Risk Factors include: advanced age (>73mn, >>82women);hypertension;dyslipidemia;male gender     Objective:    There were no vitals filed for this visit. There is no height or weight on file to calculate BMI.     12/08/2021    9:18 AM 11/25/2020    9:00 AM 08/28/2019    9:11 AM 08/15/2018    9:36 AM 06/25/2014    6:21 PM  Advanced Directives  Does Patient Have a Medical Advance Directive? Yes Yes Yes Yes Yes  Type of [place]amed[place]of [place] will Healthcare Power of [place] will Living will  Does patient want to make changes to medical advance directive?    No - Patient declined   Copy of [place] Chart? No - copy requested No - copy requested  No - copy requested No - copy requested    Current Medications (verified) Outpatient Encounter Medications as of 12/08/2021  Medication Sig   aspirin EC 81 MG tablet Take 81 mg by mouth daily.   fluticasone (FLONASE) 50 MCG/ACT nasal spray SHAKE LIQUID AND USE 2 SPRAYS IN EACH NOSTRIL EVERY DAY   lisinopril-hydrochlorothiazide (ZESTORETIC) 10-12.5 MG tablet TAKE 1 TABLET BY MOUTH DAILY   LORazepam (ATIVAN) 1 MG tablet Take 1 tablet (1 mg total) by mouth every 8 (eight) hours as needed for anxiety.   simvastatin (ZOCOR) 40 MG tablet TAKE 1 TABLET(40 MG) BY MOUTH AT BEDTIME   verapamil (CALAN) 120 MG tablet TAKE 1 TABLET(120 MG) BY MOUTH  TWICE DAILY   zolpidem (AMBIEN) 10 MG tablet Take 1 tablet (10 mg total) by mouth at bedtime as needed for sleep.   azaTHIOprine (IMURAN) 50 MG tablet Take 100 mg by mouth daily. (Patient not taking: Reported on 12/08/2021)   [DISCONTINUED] Doxepin HCl 6 MG TABS Take 1 tablet (6 mg total) by mouth at bedtime.   [DISCONTINUED] Eszopiclone 3 MG TABS Take 1 tablet (3 mg total) by mouth at bedtime. Take immediately before bedtime   No facility-administered encounter medications on file as of 12/08/2021.    Allergies (verified) Penicillins   History: Past Medical History:  Diagnosis Date   Allergy    Anxiety    Arthritis    BPH (benign prostatic hypertrophy)    Crohn's    Dysuria    History of PSVT (paroxysmal supraventricular tachycardia)    Hypercholesterolemia    May 2013 - TC 152, TG 48, HDL 60, LDL 82   Hypertension    Inguinal hernia    Insomnia    Palpitations    Prostatitis    Varicocele    Past Surgical History:  Procedure Laterality Date   ACHILLES TENDON REPAIR     INGUINAL HERNIA REPAIR     Family History  Problem Relation Age of Onset   Dementia Mother    Social History   Socioeconomic History   Marital status: Single    Spouse name: Not on file   Number of children: Not  on file   Years of education: Not on file   Highest education level: Not on file  Occupational History   Occupation: Sales   Tobacco Use   Smoking status: Never   Smokeless tobacco: Never  Substance and Sexual Activity   Alcohol use: Yes    Alcohol/week: 4.0 standard drinks of alcohol    Types: 4 Glasses of wine per week   Drug use: No   Sexual activity: Yes  Other Topics Concern   Not on file  Social History Narrative    He is a single healthy gentleman. He is an avid exerciser at the gym with treadmill, and other exercises routinely. He drinks social alcohol but otherwise is stable.    Social Determinants of Health   Financial Resource Strain: Low Risk  (12/08/2021)    Overall Financial Resource Strain (CARDIA)    Difficulty of Paying Living Expenses: Not hard at all  Food Insecurity: No Food Insecurity (12/08/2021)   Hunger Vital Sign    Worried About Running Out of Food in the Last Year: Never true    Ran Out of Food in the Last Year: Never true  Transportation Needs: No Transportation Needs (12/08/2021)   PRAPARE - Hydrologist (Medical): No    Lack of Transportation (Non-Medical): No  Physical Activity: Sufficiently Active (12/08/2021)   Exercise Vital Sign    Days of Exercise per Week: 5 days    Minutes of Exercise per Session: 90 min  Stress: Stress Concern Present (12/08/2021)   [place]    Feeling of Stress : To some extent  Social Connections: Socially Isolated (12/08/2021)   Social Connection and Isolation Panel [NHANES]    Frequency of Communication with Friends and Family: More than three times a week    Frequency of Social Gatherings with Friends and Family: More than three times a week    Attends Religious Services: Never    Marine scientist or Organizations: No    Attends Music therapist: Never    Marital Status: Never married    Tobacco Counseling Counseling given: Not Answered   Clinical Intake:  Pre-visit preparation completed: Yes  Pain : No/denies pain     Nutritional Risks: None Diabetes: No  How often do you need to have someone help you when you read instructions, pamphlets, or other written materials from your doctor or pharmacy?: 1 - Never  Diabetic? No   Interpreter Needed?: No  Information entered by :: Charlott Rakes, LPN   Activities of Daily Living    12/08/2021    9:19 AM  In your present state of health, do you have any difficulty performing the following activities:  Hearing? 0  Vision? 0  Difficulty concentrating or making decisions? 0  Walking or climbing stairs? 0   Dressing or bathing? 0  Doing errands, shopping? 0  Preparing Food and eating ? N  Using the Toilet? N  In the past six months, have you accidently leaked urine? N  Do you have problems with loss of bowel control? N  Managing your Medications? N  Managing your Finances? N  Housekeeping or managing your Housekeeping? N    Patient Care Team: Vivi Barrack, MD as PCP - General (Family Medicine) Irine Seal, MD as Consulting Physician (Urology) Carol Ada, MD as Consulting Physician (Gastroenterology)  Indicate any recent Medical Services you may have received from other than [facility] providers in the  past year (date may be approximate).     Assessment:   This is a routine wellness examination for [place].  Hearing/Vision screen Hearing Screening - Comments:: Stated slight loss  Vision Screening - Comments:: Pt follows up with a provider unsure of name   Dietary issues and exercise activities discussed: Current Exercise Habits: Home exercise routine, Type of exercise: walking, Time (Minutes): > 60, Frequency (Times/Week): 5, Weekly Exercise (Minutes/Week): 0   Goals Addressed             This Visit's Progress    Patient Stated       Maintain healthy        Depression Screen    12/08/2021    9:14 AM 09/06/2021    1:43 PM 11/25/2020    8:57 AM 07/08/2020   11:55 AM 08/28/2019    9:06 AM 08/15/2018    9:19 AM 05/20/2012    9:40 AM  PHQ 2/9 Scores  PHQ - 2 Score 1 1 0 1 0 0 0  PHQ- 9 Score  6  7       Fall Risk    12/08/2021    9:19 AM 09/06/2021    1:00 PM 11/25/2020    9:01 AM 08/28/2019    9:13 AM 08/15/2018    9:19 AM  [place] in the past year? 0 0 0 0 0  Number falls in past yr: 0 0 0 0   Injury with Fall? 0 0 0 0   Risk for fall due to : Impaired vision No Fall Risks  Other (Comment)   Risk for fall due to: Comment    Pt has had cataract surgery   Follow up Falls prevention discussed  Falls prevention discussed Falls prevention discussed     FALL  RISK PREVENTION PERTAINING TO THE HOME:  Any stairs in or around the home? Yes  If so, are there any without handrails? No  Home free of loose throw rugs in walkways, pet beds, electrical cords, etc? Yes  Adequate lighting in your home to reduce risk of falls? Yes   ASSISTIVE DEVICES UTILIZED TO PREVENT FALLS:  Life alert? No  Use of a cane, walker or w/c? No  Grab bars in the bathroom? No  Shower chair or bench in shower? No  Elevated toilet seat or a handicapped toilet? No   TIMED UP AND GO:  Was the test performed? No .   Cognitive Function:        12/08/2021    9:20 AM 11/25/2020    9:02 AM 08/28/2019    9:21 AM  6CIT Screen  What Year? 0 points 0 points 0 points  What month? 0 points 0 points 0 points  What time? 0 points 0 points 0 points  Count back from 20 0 points 0 points 0 points  Months in reverse 0 points 0 points 0 points  Repeat phrase 2 points 0 points 0 points  Total Score 2 points 0 points 0 points    Immunizations Immunization History  Administered Date(s) Administered   Fluad Quad(high Dose 65+) 10/08/2018, 12/06/2020   Influenza Inj Mdck Quad Pf 01/12/2018   Influenza Split 12/19/2010, 11/27/2011   Influenza,inj,Quad PF,6+ Mos 11/25/2012, 11/21/2013, 12/07/2014, 11/23/2016, 12/18/2019   PFIZER(Purple Top)SARS-COV-2 Vaccination 03/18/2019, 04/07/2019, 11/20/2019   Pneumococcal Conjugate-13 08/15/2018   Pneumococcal Polysaccharide-23 09/06/2021   Tdap 07/31/2008   Zoster, Live 12/19/2010    TDAP status: Due, Education has been provided regarding the importance of  this vaccine. Advised may receive this vaccine at local pharmacy or Health Dept. Aware to provide a copy of the vaccination record if obtained from local pharmacy or Health Dept. Verbalized acceptance and understanding.  Flu Vaccine status: Due, Education has been provided regarding the importance of this vaccine. Advised may receive this vaccine at local pharmacy or Health Dept. Aware to  provide a copy of the vaccination record if obtained from local pharmacy or Health Dept. Verbalized acceptance and understanding.  Pneumococcal vaccine status: Up to date  Covid-19 vaccine status: Completed vaccines  Qualifies for Shingles Vaccine? Yes   Zostavax completed No   Shingrix Completed?: No.    Education has been provided regarding the importance of this vaccine. Patient has been advised to call insurance company to determine out of pocket expense if they have not yet received this vaccine. Advised may also receive vaccine at local pharmacy or Health Dept. Verbalized acceptance and understanding.  Screening Tests Health Maintenance  Topic Date Due   Zoster Vaccines- Shingrix (1 of 2) Never done   TETANUS/TDAP  08/01/2018   COVID-19 Vaccine (4 - Pfizer risk series) 01/15/2020   INFLUENZA VACCINE  09/20/2021   COLONOSCOPY (Pts 45-47yr Insurance coverage will need to be confirmed)  07/29/2024   Pneumonia Vaccine 68 Years old  Completed   Hepatitis C Screening  Completed   HPV VACCINES  Aged Out    Health Maintenance  Health Maintenance Due  Topic Date Due   Zoster Vaccines- Shingrix (1 of 2) Never done   TETANUS/TDAP  08/01/2018   COVID-19 Vaccine (4 - Pfizer risk series) 01/15/2020   INFLUENZA VACCINE  09/20/2021    Colorectal cancer screening: Type of screening: Colonoscopy. Completed 07/30/14. Repeat every 10 years   Additional Screening:  Hepatitis C Screening: Completed 07/10/16  Vision Screening: Recommended annual ophthalmology exams for early detection of glaucoma and other disorders of the eye. Is the patient up to date with their annual eye exam?  Yes  Who is the provider or what is the name of the office in which the patient attends annual eye exams? Unsure of provider name If pt is not established with a provider, would they like to be referred to a provider to establish care? No .   Dental Screening: Recommended annual dental exams for proper oral  hygiene  Community Resource Referral / Chronic Care Management: CRR required this visit?  No   CCM required this visit?  No      Plan:     I have personally reviewed and noted the following in the patient's chart:   Medical and social history Use of alcohol, tobacco or illicit drugs  Current medications and supplements including opioid prescriptions. Patient is not currently taking opioid prescriptions. Functional ability and status Nutritional status Physical activity Advanced directives List of other physicians Hospitalizations, surgeries, and ER visits in previous 12 months Vitals Screenings to include cognitive, depression, and falls Referrals and appointments  In addition, I have reviewed and discussed with patient certain preventive protocols, quality metrics, and best practice recommendations. A written personalized care plan for preventive services as well as general preventive health recommendations were provided to patient.     TWillette Brace LPN   [phone]  Nurse Notes: none

## 2021-12-08 NOTE — Patient Instructions (Signed)
Mr. Shawn Norris , Thank you for taking time to come for your Medicare Wellness Visit. I appreciate your ongoing commitment to your health goals. Please review the following plan we discussed and let me know if I can assist you in the future.   These are the goals we discussed:  Goals      Patient Stated     Stay healthy     Patient Stated     Stay healthy      Patient Stated     Maintain healthy         This is a list of the screening recommended for you and due dates:  Health Maintenance  Topic Date Due   Zoster (Shingles) Vaccine (1 of 2) Never done   Tetanus Vaccine  08/01/2018   COVID-19 Vaccine (4 - Pfizer risk series) 01/15/2020   Flu Shot  09/20/2021   Colon Cancer Screening  07/29/2024   Pneumonia Vaccine  Completed   Hepatitis C Screening: USPSTF Recommendation to screen - Ages 18-79 yo.  Completed   HPV Vaccine  Aged Out    Advanced directives: Please bring a copy of your health care power of attorney and living will to the office at your convenience.  Conditions/risks identified: maintian health   Next appointment: Follow up in one year for your annual wellness visit.   Preventive Care 68 Years and Older, Male  Preventive care refers to lifestyle choices and visits with your health care provider that can promote health and wellness. What does preventive care include? A yearly physical exam. This is also called an annual well check. Dental exams once or twice a year. Routine eye exams. Ask your health care provider how often you should have your eyes checked. Personal lifestyle choices, including: Daily care of your teeth and gums. Regular physical activity. Eating a healthy diet. Avoiding tobacco and drug use. Limiting alcohol use. Practicing safe sex. Taking low doses of aspirin every day. Taking vitamin and mineral supplements as recommended by your health care provider. What happens during an annual well check? The services and screenings done by your  health care provider during your annual well check will depend on your age, overall health, lifestyle risk factors, and family history of disease. Counseling  Your health care provider may ask you questions about your: Alcohol use. Tobacco use. Drug use. Emotional well-being. Home and relationship well-being. Sexual activity. Eating habits. History of falls. Memory and ability to understand (cognition). Work and work Statistician. Screening  You may have the following tests or measurements: Height, weight, and BMI. Blood pressure. Lipid and cholesterol levels. These may be checked every 5 years, or more frequently if you are over 27 years old. Skin check. Lung cancer screening. You may have this screening every year starting at age 68 if you have a 30-pack-year history of smoking and currently smoke or have quit within the past 15 years. Fecal occult blood test (FOBT) of the stool. You may have this test every year starting at age 68. Flexible sigmoidoscopy or colonoscopy. You may have a sigmoidoscopy every 5 years or a colonoscopy every 10 years starting at age 68. Prostate cancer screening. Recommendations will vary depending on your family history and other risks. Hepatitis C blood test. Hepatitis B blood test. Sexually transmitted disease (STD) testing. Diabetes screening. This is done by checking your blood sugar (glucose) after you have not eaten for a while (fasting). You may have this done every 1-3 years. Abdominal aortic aneurysm (AAA) screening. You  may need this if you are a current or former smoker. Osteoporosis. You may be screened starting at age 50 if you are at high risk. Talk with your health care provider about your test results, treatment options, and if necessary, the need for more tests. Vaccines  Your health care provider may recommend certain vaccines, such as: Influenza vaccine. This is recommended every year. Tetanus, diphtheria, and acellular pertussis  (Tdap, Td) vaccine. You may need a Td booster every 10 years. Zoster vaccine. You may need this after age 68. Pneumococcal 13-valent conjugate (PCV13) vaccine. One dose is recommended after age 68. Pneumococcal polysaccharide (PPSV23) vaccine. One dose is recommended after age 68. Talk to your health care provider about which screenings and vaccines you need and how often you need them. This information is not intended to replace advice given to you by your health care provider. Make sure you discuss any questions you have with your health care provider. Document Released: 03/05/2015 Document Revised: 10/27/2015 Document Reviewed: 12/08/2014 Elsevier Interactive Patient Education  2017 [place] Prevention in the Home Falls can cause injuries. They can happen to people of all ages. There are many things you can do to make your home safe and to help prevent falls. What can I do on the outside of my home? Regularly fix the edges of walkways and driveways and fix any cracks. Remove anything that might make you trip as you walk through a door, such as a raised step or threshold. Trim any bushes or trees on the path to your home. Use bright outdoor lighting. Clear any walking paths of anything that might make someone trip, such as rocks or tools. Regularly check to see if handrails are loose or broken. Make sure that both sides of any steps have handrails. Any raised decks and porches should have guardrails on the edges. Have any leaves, snow, or ice cleared regularly. Use sand or salt on walking paths during winter. Clean up any spills in your garage right away. This includes oil or grease spills. What can I do in the bathroom? Use night lights. Install grab bars by the toilet and in the tub and shower. Do not use towel bars as grab bars. Use non-skid mats or decals in the tub or shower. If you need to sit down in the shower, use a plastic, non-slip stool. Keep the floor dry. Clean  up any water that spills on the floor as soon as it happens. Remove soap buildup in the tub or shower regularly. Attach bath mats securely with double-sided non-slip rug tape. Do not have throw rugs and other things on the floor that can make you trip. What can I do in the bedroom? Use night lights. Make sure that you have a light by your bed that is easy to reach. Do not use any sheets or blankets that are too big for your bed. They should not hang down onto the floor. Have a firm chair that has side arms. You can use this for support while you get dressed. Do not have throw rugs and other things on the floor that can make you trip. What can I do in the kitchen? Clean up any spills right away. Avoid walking on wet floors. Keep items that you use a lot in easy-to-reach places. If you need to reach something above you, use a strong step stool that has a grab bar. Keep electrical cords out of the way. Do not use floor polish or wax that  makes floors slippery. If you must use wax, use non-skid floor wax. Do not have throw rugs and other things on the floor that can make you trip. What can I do with my stairs? Do not leave any items on the stairs. Make sure that there are handrails on both sides of the stairs and use them. Fix handrails that are broken or loose. Make sure that handrails are as long as the stairways. Check any carpeting to make sure that it is firmly attached to the stairs. Fix any carpet that is loose or worn. Avoid having throw rugs at the top or bottom of the stairs. If you do have throw rugs, attach them to the floor with carpet tape. Make sure that you have a light switch at the top of the stairs and the bottom of the stairs. If you do not have them, ask someone to add them for you. What else can I do to help prevent falls? Wear shoes that: Do not have high heels. Have rubber bottoms. Are comfortable and fit you well. Are closed at the toe. Do not wear sandals. If you  use a stepladder: Make sure that it is fully opened. Do not climb a closed stepladder. Make sure that both sides of the stepladder are locked into place. Ask someone to hold it for you, if possible. Clearly mark and make sure that you can see: Any grab bars or handrails. First and last steps. Where the edge of each step is. Use tools that help you move around (mobility aids) if they are needed. These include: Canes. Walkers. Scooters. Crutches. Turn on the lights when you go into a dark area. Replace any light bulbs as soon as they burn out. Set up your furniture so you have a clear path. Avoid moving your furniture around. If any of your floors are uneven, fix them. If there are any pets around you, be aware of where they are. Review your medicines with your doctor. Some medicines can make you feel dizzy. This can increase your chance of falling. Ask your doctor what other things that you can do to help prevent falls. This information is not intended to replace advice given to you by your health care provider. Make sure you discuss any questions you have with your health care provider. Document Released: 12/03/2008 Document Revised: 07/15/2015 Document Reviewed: 03/13/2014 Elsevier Interactive Patient Education  2017 [employer].

## 2021-12-21 ENCOUNTER — Other Ambulatory Visit: Payer: Self-pay | Admitting: Family Medicine

## 2021-12-21 DIAGNOSIS — I10 Essential (primary) hypertension: Secondary | ICD-10-CM

## 2022-01-18 DIAGNOSIS — L821 Other seborrheic keratosis: Secondary | ICD-10-CM | POA: Diagnosis not present

## 2022-01-18 DIAGNOSIS — L814 Other melanin hyperpigmentation: Secondary | ICD-10-CM | POA: Diagnosis not present

## 2022-01-18 DIAGNOSIS — Z08 Encounter for follow-up examination after completed treatment for malignant neoplasm: Secondary | ICD-10-CM | POA: Diagnosis not present

## 2022-01-18 DIAGNOSIS — D225 Melanocytic nevi of trunk: Secondary | ICD-10-CM | POA: Diagnosis not present

## 2022-02-02 ENCOUNTER — Encounter: Payer: Self-pay | Admitting: *Deleted

## 2022-02-08 ENCOUNTER — Other Ambulatory Visit: Payer: Self-pay | Admitting: Family Medicine

## 2022-03-07 ENCOUNTER — Other Ambulatory Visit: Payer: Self-pay | Admitting: Family Medicine

## 2022-03-07 DIAGNOSIS — E7800 Pure hypercholesterolemia, unspecified: Secondary | ICD-10-CM

## 2022-04-24 DIAGNOSIS — K080 Exfoliation of teeth due to systemic causes: Secondary | ICD-10-CM | POA: Diagnosis not present

## 2022-05-29 DIAGNOSIS — H35372 Puckering of macula, left eye: Secondary | ICD-10-CM | POA: Diagnosis not present

## 2022-06-11 ENCOUNTER — Other Ambulatory Visit: Payer: Self-pay | Admitting: Family Medicine

## 2022-06-16 ENCOUNTER — Other Ambulatory Visit: Payer: Self-pay | Admitting: Family Medicine

## 2022-06-16 DIAGNOSIS — E7800 Pure hypercholesterolemia, unspecified: Secondary | ICD-10-CM

## 2022-06-16 DIAGNOSIS — I10 Essential (primary) hypertension: Secondary | ICD-10-CM

## 2022-07-24 DIAGNOSIS — D225 Melanocytic nevi of trunk: Secondary | ICD-10-CM | POA: Diagnosis not present

## 2022-07-24 DIAGNOSIS — L570 Actinic keratosis: Secondary | ICD-10-CM | POA: Diagnosis not present

## 2022-07-24 DIAGNOSIS — Z08 Encounter for follow-up examination after completed treatment for malignant neoplasm: Secondary | ICD-10-CM | POA: Diagnosis not present

## 2022-07-24 DIAGNOSIS — Z85828 Personal history of other malignant neoplasm of skin: Secondary | ICD-10-CM | POA: Diagnosis not present

## 2022-07-24 DIAGNOSIS — B079 Viral wart, unspecified: Secondary | ICD-10-CM | POA: Diagnosis not present

## 2022-07-24 DIAGNOSIS — D492 Neoplasm of unspecified behavior of bone, soft tissue, and skin: Secondary | ICD-10-CM | POA: Diagnosis not present

## 2022-07-24 DIAGNOSIS — L578 Other skin changes due to chronic exposure to nonionizing radiation: Secondary | ICD-10-CM | POA: Diagnosis not present

## 2022-08-10 ENCOUNTER — Other Ambulatory Visit: Payer: Self-pay | Admitting: Family Medicine

## 2022-09-23 ENCOUNTER — Other Ambulatory Visit: Payer: Self-pay | Admitting: Family Medicine

## 2022-09-23 DIAGNOSIS — Z8679 Personal history of other diseases of the circulatory system: Secondary | ICD-10-CM

## 2022-09-23 DIAGNOSIS — I10 Essential (primary) hypertension: Secondary | ICD-10-CM

## 2022-09-25 ENCOUNTER — Other Ambulatory Visit: Payer: Self-pay | Admitting: Family Medicine

## 2022-09-25 DIAGNOSIS — I10 Essential (primary) hypertension: Secondary | ICD-10-CM

## 2022-09-25 DIAGNOSIS — Z8679 Personal history of other diseases of the circulatory system: Secondary | ICD-10-CM

## 2022-09-27 ENCOUNTER — Other Ambulatory Visit: Payer: Self-pay | Admitting: Family Medicine

## 2022-09-27 DIAGNOSIS — E7800 Pure hypercholesterolemia, unspecified: Secondary | ICD-10-CM

## 2022-10-10 ENCOUNTER — Other Ambulatory Visit: Payer: Self-pay | Admitting: Family Medicine

## 2022-11-02 ENCOUNTER — Other Ambulatory Visit: Payer: Self-pay | Admitting: Family Medicine

## 2022-11-02 DIAGNOSIS — Z8679 Personal history of other diseases of the circulatory system: Secondary | ICD-10-CM

## 2022-11-02 DIAGNOSIS — I10 Essential (primary) hypertension: Secondary | ICD-10-CM

## 2022-12-05 ENCOUNTER — Encounter: Payer: Self-pay | Admitting: Family Medicine

## 2022-12-05 ENCOUNTER — Ambulatory Visit (INDEPENDENT_AMBULATORY_CARE_PROVIDER_SITE_OTHER): Payer: Medicare Other | Admitting: Family Medicine

## 2022-12-05 VITALS — BP 91/60 | HR 67 | Temp 97.5°F | Ht 66.0 in | Wt 137.2 lb

## 2022-12-05 DIAGNOSIS — E785 Hyperlipidemia, unspecified: Secondary | ICD-10-CM | POA: Diagnosis not present

## 2022-12-05 DIAGNOSIS — K50019 Crohn's disease of small intestine with unspecified complications: Secondary | ICD-10-CM | POA: Diagnosis not present

## 2022-12-05 DIAGNOSIS — Z23 Encounter for immunization: Secondary | ICD-10-CM

## 2022-12-05 DIAGNOSIS — N400 Enlarged prostate without lower urinary tract symptoms: Secondary | ICD-10-CM

## 2022-12-05 DIAGNOSIS — I10 Essential (primary) hypertension: Secondary | ICD-10-CM | POA: Diagnosis not present

## 2022-12-05 DIAGNOSIS — F341 Dysthymic disorder: Secondary | ICD-10-CM

## 2022-12-05 DIAGNOSIS — R739 Hyperglycemia, unspecified: Secondary | ICD-10-CM | POA: Diagnosis not present

## 2022-12-05 DIAGNOSIS — F410 Panic disorder [episodic paroxysmal anxiety] without agoraphobia: Secondary | ICD-10-CM

## 2022-12-05 DIAGNOSIS — Z8679 Personal history of other diseases of the circulatory system: Secondary | ICD-10-CM

## 2022-12-05 DIAGNOSIS — Z0000 Encounter for general adult medical examination without abnormal findings: Secondary | ICD-10-CM | POA: Diagnosis not present

## 2022-12-05 DIAGNOSIS — G4700 Insomnia, unspecified: Secondary | ICD-10-CM

## 2022-12-05 LAB — COMPREHENSIVE METABOLIC PANEL
ALT: 19 U/L (ref 0–53)
AST: 20 U/L (ref 0–37)
Albumin: 4.2 g/dL (ref 3.5–5.2)
Alkaline Phosphatase: 31 U/L — ABNORMAL LOW (ref 39–117)
BUN: 17 mg/dL (ref 6–23)
CO2: 30 meq/L (ref 19–32)
Calcium: 9.4 mg/dL (ref 8.4–10.5)
Chloride: 103 meq/L (ref 96–112)
Creatinine, Ser: 0.99 mg/dL (ref 0.40–1.50)
GFR: 77.75 mL/min (ref >60.00)
Glucose, Bld: 103 mg/dL — ABNORMAL HIGH (ref 70–99)
Potassium: 3.9 meq/L (ref 3.5–5.1)
Sodium: 139 meq/L (ref 135–145)
Total Bilirubin: 0.6 mg/dL (ref 0.2–1.2)
Total Protein: 7.1 g/dL (ref 6.0–8.3)

## 2022-12-05 LAB — CBC
HCT: 44.3 % (ref 39.0–52.0)
Hemoglobin: 14.5 g/dL (ref 13.0–17.0)
MCHC: 32.7 g/dL (ref 30.0–36.0)
MCV: 93.3 fL (ref 78.0–100.0)
Platelets: 215 10*3/uL (ref 150.0–400.0)
RBC: 4.75 Mil/uL (ref 4.22–5.81)
RDW: 13 % (ref 11.5–15.5)
WBC: 3.9 10*3/uL — ABNORMAL LOW (ref 4.0–10.5)

## 2022-12-05 LAB — URINALYSIS, ROUTINE W REFLEX MICROSCOPIC
Bilirubin Urine: NEGATIVE
Hgb urine dipstick: NEGATIVE
Ketones, ur: NEGATIVE
Leukocytes,Ua: NEGATIVE
Nitrite: NEGATIVE
RBC / HPF: NONE SEEN
Specific Gravity, Urine: 1.025 (ref 1.000–1.030)
Urine Glucose: NEGATIVE
Urobilinogen, UA: 0.2 (ref 0.0–1.0)
pH: 6 (ref 5.0–8.0)

## 2022-12-05 LAB — PSA: PSA: 1.81 ng/mL (ref 0.10–4.00)

## 2022-12-05 LAB — LIPID PANEL
Cholesterol: 143 mg/dL (ref 0–200)
HDL: 63.1 mg/dL (ref >39.00)
LDL Cholesterol: 65 mg/dL (ref 0–99)
NonHDL: 79.81
Total CHOL/HDL Ratio: 2
Triglycerides: 74 mg/dL (ref 0.0–149.0)
VLDL: 14.8 mg/dL (ref 0.0–40.0)

## 2022-12-05 LAB — TSH: TSH: 1.35 u[IU]/mL (ref 0.35–5.50)

## 2022-12-05 LAB — VITAMIN B12: Vitamin B-12: 286 pg/mL (ref 211–911)

## 2022-12-05 LAB — HEMOGLOBIN A1C: Hgb A1c MFr Bld: 5.5 % (ref 4.6–6.5)

## 2022-12-05 MED ORDER — VERAPAMIL HCL 120 MG PO TABS
ORAL_TABLET | ORAL | 3 refills | Status: DC
Start: 2022-12-05 — End: 2023-02-12

## 2022-12-05 MED ORDER — ZOLPIDEM TARTRATE ER 12.5 MG PO TBCR: 12.5000 mg | EXTENDED_RELEASE_TABLET | Freq: Every evening | ORAL | 0 refills | Status: DC | PRN

## 2022-12-05 MED ORDER — FLUTICASONE PROPIONATE 50 MCG/ACT NA SUSP: NASAL | 1 refills | Status: AC

## 2022-12-05 NOTE — Assessment & Plan Note (Signed)
Blood pressure at goal today on lisinopril-HCTZ 10-12.5 once daily and verapamil 120 mg twice daily.

## 2022-12-05 NOTE — Assessment & Plan Note (Signed)
Are all his symptoms are persistent but manageable.  He does use Ativan daily as needed for anxiety.  Does not need refill today.  We have tried Celexa in the past which did not work well.  He would like to avoid daily medications at this point if possible.  We treating his insomnia as above as well.  He will follow-up with [place] in a few weeks via MyChart.  He will let us know if he would like to have referral to see therapist or start daily medication such as alternative SSRI.

## 2022-12-05 NOTE — Assessment & Plan Note (Signed)
Check PSA and urinalysis.  Symptoms are overall stable.

## 2022-12-05 NOTE — Progress Notes (Signed)
Chief Complaint:  Shawn Norris is a 66 y.o. male who presents today for his annual comprehensive physical exam.    Assessment/Plan:  Chronic Problems Addressed Today: Insomnia Symptoms are still not well-controlled.  Was previously on Lunesta as well which has not worked well.  He is waking up in the melanite and have a trouble falling back asleep.  We will switch Ambien to controlled release formulation to see if he does better with this.  He will follow-up with [place] in a few weeks via MyChart.  Hypertension Blood pressure at goal today on lisinopril-HCTZ 10-12.5 once daily and verapamil 120 mg twice daily.  Hyperlipidemia LDL goal <100 Check lipids.  He is on simvastatin 40 mg daily and tolerating well.  Crohn's disease Following with GI.  He is doing well.  Not currently on any medications.  No recent flares.  Dysthymia Are all his symptoms are persistent but manageable.  He does use Ativan daily as needed for anxiety.  Does not need refill today.  We have tried Celexa in the past which did not work well.  He would like to avoid daily medications at this point if possible.  We treating his insomnia as above as well.  He will follow-up with [place] in a few weeks via MyChart.  He will let us know if he would like to have referral to see therapist or start daily medication such as alternative SSRI.  BPH (benign prostatic hyperplasia) Check PSA and urinalysis.  Symptoms are overall stable.  Panic attacks Symptoms currently manageable on Ativan daily as needed.  He did not have any improvement with Celexa or BuSpar in the past.  He would like to hold off on trial of alternative SSRI at this point though will let us know if he changes mind.  Preventative Healthcare: Check labs.  Flu shot given today.  Patient Counseling(The following topics were reviewed and/or handout was given):  -Nutrition: Stressed importance of moderation in sodium/caffeine intake, saturated fat and cholesterol,  caloric balance, sufficient intake of fresh fruits, vegetables, and fiber.  -Stressed the importance of regular exercise.   -Substance Abuse: Discussed cessation/primary prevention of tobacco, alcohol, or other drug use; driving or other dangerous activities under the influence; availability of treatment for abuse.   -Injury prevention: Discussed safety belts, safety helmets, smoke detector, smoking near bedding or upholstery.   -Sexuality: Discussed sexually transmitted diseases, partner selection, use of condoms, avoidance of unintended pregnancy and contraceptive alternatives.   -Dental health: Discussed importance of regular tooth brushing, flossing, and dental visits.  -Health maintenance and immunizations reviewed. Please refer to Health maintenance section.  Return to care in 1 year for next preventative visit.     Subjective:  HPI:  He has no acute complaints today.  See Assessment / plan for status of chronic conditions.   Lifestyle Diet: Balanced. Plenty of fruits and vegetables.  Exercise: Goes to gym routinely and plays golf regularly.      12/05/2022    9:52 AM  Depression screen PHQ 2/9  Decreased Interest 0  Down, Depressed, Hopeless 0  PHQ - 2 Score 0  Altered sleeping 0  Tired, decreased energy 0  Change in appetite 0  Feeling bad or failure about yourself  0  Trouble concentrating 0  Moving slowly or fidgety/restless 0  Suicidal thoughts 0  PHQ-9 Score 0  Difficult doing work/chores Not difficult at all    Health Maintenance Due  Topic Date Due   Medicare Annual Wellness (AWV)  12/09/2022     ROS: Per HPI, otherwise a complete review of systems was negative.   PMH:  The following were reviewed and entered/updated in epic: Past Medical History:  Diagnosis Date   Allergy    Anxiety    Arthritis    BPH (benign prostatic hypertrophy)    Crohn's    Dysuria    History of PSVT (paroxysmal supraventricular tachycardia)    Hypercholesterolemia    May  2013 - TC 152, TG 48, HDL 60, LDL 82   Hypertension    Inguinal hernia    Insomnia    Palpitations    Prostatitis    Varicocele    Patient Active Problem List   Diagnosis Date Noted   Actinic keratosis 08/18/2019   BPH (benign prostatic hyperplasia) 05/14/2019   Panic attacks 01/28/2019   Insomnia 01/28/2019   PSVT (paroxysmal supraventricular tachycardia) (HCC) 08/15/2018   Allergic rhinitis 06/17/2013   Dysthymia 06/17/2013   Actinic keratosis of scalp 01/08/2013   Hypertension 07/03/2011   Hyperlipidemia LDL goal <100 07/03/2011   Crohn's disease (HCC) 07/03/2011   Past Surgical History:  Procedure Laterality Date   ACHILLES TENDON REPAIR     INGUINAL HERNIA REPAIR      Family History  Problem Relation Age of Onset   Dementia Mother     Medications- reviewed and updated Current Outpatient Medications  Medication Sig Dispense Refill   lisinopril-hydrochlorothiazide (ZESTORETIC) 10-12.5 MG tablet TAKE 1 TABLET BY MOUTH DAILY 90 tablet 1   LORazepam (ATIVAN) 1 MG tablet TAKE 1 TABLET(1 MG) BY MOUTH EVERY 8 HOURS AS NEEDED FOR ANXIETY 90 tablet 3   simvastatin (ZOCOR) 40 MG tablet TAKE 1 TABLET(40 MG) BY MOUTH AT BEDTIME 90 tablet 0   zolpidem (AMBIEN CR) 12.5 MG CR tablet Take 1 tablet (12.5 mg total) by mouth at bedtime as needed for sleep. 30 tablet 0   fluticasone (FLONASE) 50 MCG/ACT nasal spray SHAKE LIQUID AND USE 2 SPRAYS IN EACH NOSTRIL EVERY DAY 48 g 1   verapamil (CALAN) 120 MG tablet TAKE 1 TABLET(120 MG) BY MOUTH TWICE DAILY 180 tablet 3   No current facility-administered medications for this visit.    Allergies-reviewed and updated Allergies  Allergen Reactions   Penicillins Rash    Social History   Socioeconomic History   Marital status: Single    Spouse name: Not on file   Number of children: Not on file   Years of education: Not on file   Highest education level: Not on file  Occupational History   Occupation: Sales   Tobacco Use    Smoking status: Never   Smokeless tobacco: Never  Substance and Sexual Activity   Alcohol use: Yes    Alcohol/week: 4.0 standard drinks of alcohol    Types: 4 Glasses of wine per week   Drug use: No   Sexual activity: Yes  Other Topics Concern   Not on file  Social History Narrative    He is a single healthy gentleman. He is an avid exerciser at the gym with treadmill, and other exercises routinely. He drinks social alcohol but otherwise is stable.    Social Determinants of Health   Financial Resource Strain: Low Risk  (12/08/2021)   Overall Financial Resource Strain (CARDIA)    Difficulty of Paying Living Expenses: Not hard at all  Food Insecurity: No Food Insecurity (12/08/2021)   Hunger Vital Sign    Worried About Running Out of Food in the Last Year: Never true  Ran Out of Food in the Last Year: Never true  Transportation Needs: No Transportation Needs (12/08/2021)   PRAPARE - Administrator, Civil Service (Medical): No    Lack of Transportation (Non-Medical): No  Physical Activity: Sufficiently Active (12/08/2021)   Exercise Vital Sign    Days of Exercise per Week: 5 days    Minutes of Exercise per Session: 90 min  Stress: Stress Concern Present (12/08/2021)   [employer] of Occupational Health - Occupational Stress Questionnaire    Feeling of Stress : To some extent  Social Connections: Socially Isolated (12/08/2021)   Social Connection and Isolation Panel [NHANES]    Frequency of Communication with Friends and Family: More than three times a week    Frequency of Social Gatherings with Friends and Family: More than three times a week    Attends Religious Services: Never    Database administrator or Organizations: No    Attends Engineer, structural: Never    Marital Status: Never married        Objective:  Physical Exam: BP 91/60   Pulse 67   Temp (!) 97.5 F (36.4 C) (Temporal)   Ht 5\' 6"  (1.676 m)   Wt 137 lb 3.2 oz (62.2 kg)    SpO2 99%   BMI 22.14 kg/m   Body mass index is 22.14 kg/m. Wt Readings from Last 3 Encounters:  12/05/22 137 lb 3.2 oz (62.2 kg)  09/06/21 133 lb 9.6 oz (60.6 kg)  12/06/20 128 lb 4 oz (58.2 kg)   Gen: NAD, resting comfortably HEENT: TMs normal bilaterally. OP clear. No thyromegaly noted.  CV: RRR with no murmurs appreciated Pulm: NWOB, CTAB with no crackles, wheezes, or rhonchi GI: Normal bowel sounds present. Soft, Nontender, Nondistended. MSK: no edema, cyanosis, or clubbing noted Skin: warm, dry Neuro: CN2-12 grossly intact. Strength 5/5 in upper and lower extremities. Reflexes symmetric and intact bilaterally.  Psych: Normal affect and thought content     Alpha Mysliwiec M. Jimmey Ralph, MD 12/05/2022 10:34 AM

## 2022-12-05 NOTE — Assessment & Plan Note (Signed)
Check lipids.  He is on simvastatin 40 mg daily and tolerating well.

## 2022-12-05 NOTE — Patient Instructions (Addendum)
It was very nice to see you today!  We will check labs today.  Please try the extended release Ambien.  Send me a message in a few weeks to let me know how this is working for you.  We will give your flu shot today.  Please continue to work on diet and exercise.  Will see back in year for your next physical.  Come back sooner if needed.  Return in about 1 year (around 12/05/2023) for Annual Physical.   Take care, Dr Jimmey Ralph  PLEASE NOTE:  If you had any lab tests, please let us know if you have not heard back within a few days. You may see your results on mychart before we have a chance to review them but we will give you a call once they are reviewed by [place].   If we ordered any referrals today, please let us know if you have not heard from their office within the next week.   If you had any urgent prescriptions sent in today, please check with the pharmacy within an hour of our visit to make sure the prescription was transmitted appropriately.   Please try these tips to maintain a healthy lifestyle:  Eat at least 3 REAL meals and 1-2 snacks per day.  Aim for no more than 5 hours between eating.  If you eat breakfast, please do so within one hour of getting up.   Each meal should contain half fruits/vegetables, one quarter protein, and one quarter carbs (no bigger than a computer mouse)  Cut down on sweet beverages. This includes juice, soda, and sweet tea.   Drink at least 1 glass of water with each meal and aim for at least 8 glasses per day  Exercise at least 150 minutes every week.    Preventive Care 24 Years and Older, Male Preventive care refers to lifestyle choices and visits with your health care provider that can promote health and wellness. Preventive care visits are also called wellness exams. What can I expect for my preventive care visit? Counseling During your preventive care visit, your health care provider may ask about your: Medical history, including: Past  medical problems. Family medical history. History of falls. Current health, including: Emotional well-being. Home life and relationship well-being. Sexual activity. Memory and ability to understand (cognition). Lifestyle, including: Alcohol, nicotine or tobacco, and drug use. Access to firearms. Diet, exercise, and sleep habits. Work and work Astronomer. Sunscreen use. Safety issues such as seatbelt and bike helmet use. Physical exam Your health care provider will check your: Height and weight. These may be used to calculate your BMI (body mass index). BMI is a measurement that tells if you are at a healthy weight. Waist circumference. This measures the distance around your waistline. This measurement also tells if you are at a healthy weight and may help predict your risk of certain diseases, such as type 2 diabetes and high blood pressure. Heart rate and blood pressure. Body temperature. Skin for abnormal spots. What immunizations do I need?  Vaccines are usually given at various ages, according to a schedule. Your health care provider will recommend vaccines for you based on your age, medical history, and lifestyle or other factors, such as travel or where you work. What tests do I need? Screening Your health care provider may recommend screening tests for certain conditions. This may include: Lipid and cholesterol levels. Diabetes screening. This is done by checking your blood sugar (glucose) after you have not eaten for a  while (fasting). Hepatitis C test. Hepatitis B test. HIV (human immunodeficiency virus) test. STI (sexually transmitted infection) testing, if you are at risk. Lung cancer screening. Colorectal cancer screening. Prostate cancer screening. Abdominal aortic aneurysm (AAA) screening. You may need this if you are a current or former smoker. Talk with your health care provider about your test results, treatment options, and if necessary, the need for more  tests. Follow these instructions at home: Eating and drinking  Eat a diet that includes fresh fruits and vegetables, whole grains, lean protein, and low-fat dairy products. Limit your intake of foods with high amounts of sugar, saturated fats, and salt. Take vitamin and mineral supplements as recommended by your health care provider. Do not drink alcohol if your health care provider tells you not to drink. If you drink alcohol: Limit how much you have to 0-2 drinks a day. Know how much alcohol is in your drink. In the [place] one drink equals one 12 oz bottle of beer (355 mL), one 5 oz glass of wine (148 mL), or one 1 oz glass of hard liquor (44 mL). Lifestyle Brush your teeth every morning and night with fluoride toothpaste. Floss one time each day. Exercise for at least 30 minutes 5 or more days each week. Do not use any products that contain nicotine or tobacco. These products include cigarettes, chewing tobacco, and vaping devices, such as e-cigarettes. If you need help quitting, ask your health care provider. Do not use drugs. If you are sexually active, practice safe sex. Use a condom or other form of protection to prevent STIs. Take aspirin only as told by your health care provider. Make sure that you understand how much to take and what form to take. Work with your health care provider to find out whether it is safe and beneficial for you to take aspirin daily. Ask your health care provider if you need to take a cholesterol-lowering medicine (statin). Find healthy ways to manage stress, such as: Meditation, yoga, or listening to music. Journaling. Talking to a trusted person. Spending time with friends and family. Safety Always wear your seat belt while driving or riding in a vehicle. Do not drive: If you have been drinking alcohol. Do not ride with someone who has been drinking. When you are tired or distracted. While texting. If you have been using any mind-altering substances  or drugs. Wear a helmet and other protective equipment during sports activities. If you have firearms in your house, make sure you follow all gun safety procedures. Minimize exposure to UV radiation to reduce your risk of skin cancer. What's next? Visit your health care provider once a year for an annual wellness visit. Ask your health care provider how often you should have your eyes and teeth checked. Stay up to date on all vaccines. This information is not intended to replace advice given to you by your health care provider. Make sure you discuss any questions you have with your health care provider. Document Revised: 08/04/2020 Document Reviewed: 08/04/2020 Elsevier Patient Education  2024 [employer].

## 2022-12-05 NOTE — Assessment & Plan Note (Signed)
Symptoms are still not well-controlled.  Was previously on Lunesta as well which has not worked well.  He is waking up in the melanite and have a trouble falling back asleep.  We will switch Ambien to controlled release formulation to see if he does better with this.  He will follow-up with [place] in a few weeks via MyChart.

## 2022-12-05 NOTE — Assessment & Plan Note (Signed)
Following with GI.  He is doing well.  Not currently on any medications.  No recent flares.

## 2022-12-05 NOTE — Assessment & Plan Note (Signed)
Symptoms currently manageable on Ativan daily as needed.  He did not have any improvement with Celexa or BuSpar in the past.  He would like to hold off on trial of alternative SSRI at this point though will let us know if he changes mind.

## 2022-12-07 NOTE — Progress Notes (Signed)
Labs are all stable.  Do not need to make any adjustments to treatment plan.  He should continue to work on diet and exercise and we can recheck everything in a year or so.

## 2022-12-13 ENCOUNTER — Telehealth: Payer: Self-pay | Admitting: *Deleted

## 2022-12-13 ENCOUNTER — Other Ambulatory Visit: Payer: Self-pay | Admitting: Family Medicine

## 2022-12-13 NOTE — Telephone Encounter (Signed)
Request has been denied for coverage. 30 alternative meds to try first. They will fax a list for him to try first.

## 2022-12-13 NOTE — Telephone Encounter (Signed)
(  Key: [id number]) - [id number] Zolpidem Tartrate ER 12.5MG  er tablets status: PA RequestCreated: October 23rd, 2024 [phone]: October 23rd, 2024 Waiting for determination

## 2022-12-13 NOTE — Telephone Encounter (Signed)
Patient called to check in on refill request. States pharmacy informed him they sent request a couple of days ago. I informed pt it was received today. Pt verbalized understanding but states he is now out of medication.

## 2022-12-14 NOTE — Telephone Encounter (Signed)
Noted  

## 2022-12-19 DIAGNOSIS — Z0000 Encounter for general adult medical examination without abnormal findings: Secondary | ICD-10-CM

## 2022-12-19 NOTE — Progress Notes (Signed)
Erroneous encounter This encounter was created in error - please disregard. 

## 2022-12-23 ENCOUNTER — Other Ambulatory Visit: Payer: Self-pay | Admitting: Family Medicine

## 2022-12-23 DIAGNOSIS — I10 Essential (primary) hypertension: Secondary | ICD-10-CM

## 2023-01-09 ENCOUNTER — Ambulatory Visit (INDEPENDENT_AMBULATORY_CARE_PROVIDER_SITE_OTHER): Payer: Medicare Other | Admitting: Family

## 2023-01-09 VITALS — BP 117/71 | HR 75 | Temp 98.0°F | Ht 66.0 in | Wt 138.2 lb

## 2023-01-09 DIAGNOSIS — R21 Rash and other nonspecific skin eruption: Secondary | ICD-10-CM | POA: Diagnosis not present

## 2023-01-09 MED ORDER — MUPIROCIN CALCIUM 2 % EX CREA
1.0000 | TOPICAL_CREAM | Freq: Two times a day (BID) | CUTANEOUS | 1 refills | Status: DC
Start: 2023-01-09 — End: 2023-12-13

## 2023-01-09 NOTE — Progress Notes (Signed)
Patient ID: Shawn Norris, male    DOB: 10/06/1955, 67 y.o.   MRN: [id number]  Chief Complaint  Patient presents with   Rash    Pt c/o Rash on buttocks, Redness and itchy. Present for a couple of weeks. Has tried hydrocortisone cream which did not help sx.    Discussed the use of AI scribe software for clinical note transcription with the patient, who gave verbal consent to proceed.  History of Present Illness   The patient, with a past medical history of shingles, presents with a persistent rash on his buttocks that has been present for several weeks. The rash initially started as a small area of redness and itching in the upper part of the buttocks, which he managed with over-the-counter hydrocortisone cream. However, the rash returned, spreading and worsening in severity. He denies any pain but reports itching and discomfort, especially when sitting. He has a history of shingles but denies any similar symptoms with the current rash. He is active during the day and does not sit for long periods. He also has a history of Crohn's disease but is not currently on medication for it.     Assessment & Plan:     Skin Rash  - Persistent rash in the perianal region, initially presenting as redness and itching, which has worsened and spread over 2-3 weeks. The rash has a shingles-like appearance but lacks the associated pain. The patient has been self-treating with various hydrocortisone creams with limited success.    -Prescribe Mupirocin ointment 2%, apply a thin layer daily twice a day.  -Advise patient to clean the area daily with soap and water (Hibiclens or antibacterial soap) pat dry, and apply a thin layer of the ointment.   -Ok to apply a thin layer of the hydrocortisone cream over the ointment to help with itching. -Call back if no improvement, and will consider oral antibiotics. -If no improvement, consider referral to dermatology for further evaluation.    Shingles Vaccination   - Patient has received the first dose of the shingles vaccine but has not yet received the second dose.   -Advised patient to receive the second dose of the shingles vaccine at his local pharmacy.     Subjective:    Outpatient Medications Prior to Visit  Medication Sig Dispense Refill   fluticasone (FLONASE) 50 MCG/ACT nasal spray SHAKE LIQUID AND USE 2 SPRAYS IN EACH NOSTRIL EVERY DAY 48 g 1   lisinopril-hydrochlorothiazide (ZESTORETIC) 10-12.5 MG tablet TAKE 1 TABLET BY MOUTH DAILY 90 tablet 1   LORazepam (ATIVAN) 1 MG tablet TAKE 1 TABLET(1 MG) BY MOUTH EVERY 8 HOURS AS NEEDED FOR ANXIETY 90 tablet 3   simvastatin (ZOCOR) 40 MG tablet TAKE 1 TABLET(40 MG) BY MOUTH AT BEDTIME 90 tablet 0   verapamil (CALAN) 120 MG tablet TAKE 1 TABLET(120 MG) BY MOUTH TWICE DAILY 180 tablet 3   zolpidem (AMBIEN CR) 12.5 MG CR tablet Take 1 tablet (12.5 mg total) by mouth at bedtime as needed for sleep. 30 tablet 0   zolpidem (AMBIEN) 10 MG tablet TAKE 1 TABLET BY MOUTH AT BEDTIME AS NEEDED FOR SLEEP 30 tablet 5   No facility-administered medications prior to visit.   Past Medical History:  Diagnosis Date   Allergy    Anxiety    Arthritis    BPH (benign prostatic hypertrophy)    Crohn's    Dysuria    History of PSVT (paroxysmal supraventricular tachycardia)    Hypercholesterolemia  May 2013 - TC 152, TG 48, HDL 60, LDL 82   Hypertension    Inguinal hernia    Insomnia    Palpitations    Prostatitis    Varicocele    Past Surgical History:  Procedure Laterality Date   ACHILLES TENDON REPAIR     INGUINAL HERNIA REPAIR     Allergies  Allergen Reactions   Penicillins Rash      Objective:  Physical Exam Vitals and nursing note reviewed. Exam conducted with a chaperone present.  Constitutional:      Appearance: Normal appearance.  HENT:     Head: Normocephalic.  Cardiovascular:     Rate and Rhythm: Normal rate and regular rhythm.  Pulmonary:     Effort: Pulmonary effort is normal.      Breath sounds: Normal breath sounds.  Musculoskeletal:        General: Normal range of motion.     Cervical back: Normal range of motion.       Legs:  Skin:    General: Skin is warm.     Findings: Rash (bilateral buttocks, several closed sores noted on left, mild diffuse erythema on both cheeks) present.  Neurological:     Mental Status: He is alert and oriented to person, place, and time.  Psychiatric:        Mood and Affect: Mood normal.      BP 117/71   Pulse 75   Temp 98 F (36.7 C) (Temporal)   Ht 5\' 6"  (1.676 m)   Wt 138 lb 4 oz (62.7 kg)   SpO2 97%   BMI 22.31 kg/m  Wt Readings from Last 3 Encounters:  01/09/23 138 lb 4 oz (62.7 kg)  12/05/22 137 lb 3.2 oz (62.2 kg)  09/06/21 133 lb 9.6 oz (60.6 kg)       Dulce Sellar, NP

## 2023-01-12 ENCOUNTER — Other Ambulatory Visit: Payer: Self-pay | Admitting: Family Medicine

## 2023-01-12 DIAGNOSIS — E7800 Pure hypercholesterolemia, unspecified: Secondary | ICD-10-CM

## 2023-02-12 ENCOUNTER — Other Ambulatory Visit: Payer: Self-pay | Admitting: Family Medicine

## 2023-02-12 DIAGNOSIS — I10 Essential (primary) hypertension: Secondary | ICD-10-CM

## 2023-02-12 DIAGNOSIS — Z8679 Personal history of other diseases of the circulatory system: Secondary | ICD-10-CM

## 2023-03-14 ENCOUNTER — Other Ambulatory Visit: Payer: Self-pay | Admitting: Family Medicine

## 2023-04-18 ENCOUNTER — Other Ambulatory Visit: Payer: Self-pay | Admitting: Family Medicine

## 2023-04-18 DIAGNOSIS — E7800 Pure hypercholesterolemia, unspecified: Secondary | ICD-10-CM

## 2023-05-01 DIAGNOSIS — K080 Exfoliation of teeth due to systemic causes: Secondary | ICD-10-CM | POA: Diagnosis not present

## 2023-05-08 ENCOUNTER — Other Ambulatory Visit: Payer: Self-pay | Admitting: Family Medicine

## 2023-05-08 DIAGNOSIS — Z8679 Personal history of other diseases of the circulatory system: Secondary | ICD-10-CM

## 2023-05-08 DIAGNOSIS — I10 Essential (primary) hypertension: Secondary | ICD-10-CM

## 2023-05-30 DIAGNOSIS — H40013 Open angle with borderline findings, low risk, bilateral: Secondary | ICD-10-CM | POA: Diagnosis not present

## 2023-06-18 ENCOUNTER — Other Ambulatory Visit: Payer: Self-pay | Admitting: *Deleted

## 2023-06-18 DIAGNOSIS — I10 Essential (primary) hypertension: Secondary | ICD-10-CM

## 2023-06-18 MED ORDER — LISINOPRIL-HYDROCHLOROTHIAZIDE 10-12.5 MG PO TABS: 1.0000 | ORAL_TABLET | Freq: Every day | ORAL | 1 refills | Status: DC

## 2023-06-18 NOTE — Telephone Encounter (Signed)
 Pharmacy requesting Rx refill Zolpidem  10mg 

## 2023-06-19 MED ORDER — ZOLPIDEM TARTRATE 10 MG PO TABS: 10.0000 mg | ORAL_TABLET | Freq: Every evening | ORAL | 5 refills | Status: DC | PRN

## 2023-06-27 ENCOUNTER — Telehealth: Payer: Self-pay | Admitting: *Deleted

## 2023-06-27 NOTE — Telephone Encounter (Signed)
 Copied from CRM [id number]. Topic: General - Other >> Jun 27, 2023  9:29 AM Allyne Areola wrote: Reason for CRM: Patient is calling because he is in a clinical study and they took his vitals and blood pressure was 99/64. He would like Dr.Parker to know and see if he recommends him coming in for an appointment or if he should keep on with his blood pressure medication and daily lifestyle.   Spoke with patient, patient asymptomatic   schedule an office visit with PCP  Horald Lyme

## 2023-07-02 ENCOUNTER — Encounter: Payer: Self-pay | Admitting: Family Medicine

## 2023-07-02 ENCOUNTER — Ambulatory Visit (INDEPENDENT_AMBULATORY_CARE_PROVIDER_SITE_OTHER): Admitting: Family Medicine

## 2023-07-02 VITALS — BP 124/74 | HR 68 | Temp 97.5°F | Ht 69.0 in | Wt 142.0 lb

## 2023-07-02 DIAGNOSIS — F341 Dysthymic disorder: Secondary | ICD-10-CM

## 2023-07-02 DIAGNOSIS — I471 Supraventricular tachycardia, unspecified: Secondary | ICD-10-CM | POA: Diagnosis not present

## 2023-07-02 DIAGNOSIS — I10 Essential (primary) hypertension: Secondary | ICD-10-CM | POA: Diagnosis not present

## 2023-07-02 DIAGNOSIS — F410 Panic disorder [episodic paroxysmal anxiety] without agoraphobia: Secondary | ICD-10-CM

## 2023-07-02 NOTE — Progress Notes (Signed)
   Shawn Norris is a 68 y.o. male who presents today for an office visit.  Assessment/Plan:  New/Acute Problems: Cerumen impaction Successfully irrigated by RMA today.  He can use Debrox as needed prevent buildup.  Chronic Problems Addressed Today: Hypertension Blood pressure 124/74 today though he he has had low readings at his clinical research study.  He has had occasional readings on the lower side here as well.  He is interested in potentially decreasing some of his medications.  We will stop his lisinopril  HCTZ.  Continue verapamil  120 mg twice daily.  Does have a remote history of SVT and he would like to continue with verapamil  for now.  He will monitor his blood pressure at home and follow-up with us  in a couple of weeks and we can adjust medications as tolerated.  PSVT (paroxysmal supraventricular tachycardia) (HCC) On verapamil  120 mg twice daily.  Regular rate and rhythm today.  No recent recurrences.  Panic attacks Symptoms are currently manageable on Ativan  as needed.  He has been on Celexa  and BuSpar  in the past without any improvement.  We did discuss trial of alternative SSRI or referral to see a therapist however he declined.  Dysthymia See panic attack  A/P.  Still having ongoing issues with this however symptoms are manageable.  He does not wish to make any medication change at this point.  Does not wish to see a therapist.  He will see us  again in a few months though he can let us  know if he changes his mind in the meantime about changes to his treatment plan.     Subjective:  HPI:  See A/P for status of chronic conditions.  Patient is here today for follow-up.  I last saw him about 6 months ago for annual physical.  Primary concern today is blood pressure.  He is currently in clinical study.  They routinely check vitals with this and recently was found to be 99/64. He does sometimes get dizziness.  Overall feels well today.  No acute concerns.  He would like to  stop one of his medications if possible.  No chest pain or shortness of breath.  He was also told that he has earwax blockage in his left ear.       Objective:  Physical Exam: BP 124/74   Pulse 68   Temp (!) 97.5 F (36.4 C) (Temporal)   Ht 5\' 9"  (1.753 m)   Wt 142 lb (64.4 kg)   SpO2 98%   BMI 20.97 kg/m   Gen: No acute distress, resting comfortably HEENT: Right TM clear.  Left EAC with cerumen impaction. CV: Regular rate and rhythm with no murmurs appreciated Pulm: Normal work of breathing, clear to auscultation bilaterally with no crackles, wheezes, or rhonchi Neuro: Grossly normal, moves all extremities Psych: Normal affect and thought content      Mycal Conde M. Daneil Dunker, MD 07/02/2023 1:43 PM

## 2023-07-02 NOTE — Patient Instructions (Signed)
 It was very nice to see you today!  Please stop the lisinopril  HCTZ.  Monitor your blood pressure at home with follow-up with us  in a few weeks.  Will flush out your ears today.  Please let us  know how you are doing in a couple of weeks.  Return in about 6 months (around 01/02/2024) for Annual Physical.   Take care, Dr Daneil Dunker  PLEASE NOTE:  If you had any lab tests, please let us  know if you have not heard back within a few days. You may see your results on mychart before we have a chance to review them but we will give you a call once they are reviewed by us .   If we ordered any referrals today, please let us  know if you have not heard from their office within the next week.   If you had any urgent prescriptions sent in today, please check with the pharmacy within an hour of our visit to make sure the prescription was transmitted appropriately.   Please try these tips to maintain a healthy lifestyle:  Eat at least 3 REAL meals and 1-2 snacks per day.  Aim for no more than 5 hours between eating.  If you eat breakfast, please do so within one hour of getting up.   Each meal should contain half fruits/vegetables, one quarter protein, and one quarter carbs (no bigger than a computer mouse)  Cut down on sweet beverages. This includes juice, soda, and sweet tea.   Drink at least 1 glass of water with each meal and aim for at least 8 glasses per day  Exercise at least 150 minutes every week.

## 2023-07-02 NOTE — Assessment & Plan Note (Signed)
 See panic attack  A/P.  Still having ongoing issues with this however symptoms are manageable.  He does not wish to make any medication change at this point.  Does not wish to see a therapist.  He will see us  again in a few months though he can let us  know if he changes his mind in the meantime about changes to his treatment plan.

## 2023-07-02 NOTE — Assessment & Plan Note (Signed)
 Symptoms are currently manageable on Ativan  as needed.  He has been on Celexa  and BuSpar  in the past without any improvement.  We did discuss trial of alternative SSRI or referral to see a therapist however he declined.

## 2023-07-02 NOTE — Assessment & Plan Note (Signed)
 Blood pressure 124/74 today though he he has had low readings at his clinical research study.  He has had occasional readings on the lower side here as well.  He is interested in potentially decreasing some of his medications.  We will stop his lisinopril  HCTZ.  Continue verapamil  120 mg twice daily.  Does have a remote history of SVT and he would like to continue with verapamil  for now.  He will monitor his blood pressure at home and follow-up with us  in a couple of weeks and we can adjust medications as tolerated.

## 2023-07-02 NOTE — Assessment & Plan Note (Signed)
 On verapamil  120 mg twice daily.  Regular rate and rhythm today.  No recent recurrences.

## 2023-07-18 ENCOUNTER — Other Ambulatory Visit: Payer: Self-pay | Admitting: *Deleted

## 2023-07-18 DIAGNOSIS — E7800 Pure hypercholesterolemia, unspecified: Secondary | ICD-10-CM

## 2023-07-18 MED ORDER — SIMVASTATIN 40 MG PO TABS: ORAL_TABLET | ORAL | 0 refills | Status: DC

## 2023-09-27 ENCOUNTER — Other Ambulatory Visit: Payer: Self-pay | Admitting: *Deleted

## 2023-09-27 MED ORDER — ZOLPIDEM TARTRATE 10 MG PO TABS: 10.0000 mg | ORAL_TABLET | Freq: Every evening | ORAL | 5 refills | Status: DC | PRN

## 2023-09-27 NOTE — Telephone Encounter (Signed)
 [facility] pharmacy requesting Rx Lorazepam  refill

## 2023-10-08 ENCOUNTER — Other Ambulatory Visit: Payer: Self-pay | Admitting: *Deleted

## 2023-10-08 NOTE — Telephone Encounter (Signed)
 Pharmacy [facility] requesting Rx Lorazepam  refill

## 2023-10-09 MED ORDER — LORAZEPAM 1 MG PO TABS: 1.0000 mg | ORAL_TABLET | Freq: Three times a day (TID) | ORAL | 5 refills | Status: AC | PRN

## 2023-10-10 ENCOUNTER — Other Ambulatory Visit (HOSPITAL_COMMUNITY): Payer: Self-pay

## 2023-10-10 ENCOUNTER — Telehealth: Payer: Self-pay

## 2023-10-10 NOTE — Telephone Encounter (Signed)
 Pharmacy Patient Advocate Encounter   Received notification from Onbase that prior authorization for LORazepam  1MG  tablets  is required/requested.   Insurance verification completed.   The patient is insured through [facility] .   Per test claim: PA required; PA submitted to above mentioned insurance via Latent Key/confirmation #/EOC [facility] Status is pending

## 2023-10-11 NOTE — Telephone Encounter (Unsigned)
 Copied from CRM #[id number]. Topic: Clinical - Medication Prior Auth >> Oct 10, 2023  4:51 PM Sophia H wrote: Reason for CRM: Spoke with Terri with [facility] [employer] who states authorization for LORazepam  (ATIVAN ) 1 MG tablet has been approved for 1 year. 09/2023-09/2024. Will fax over approval.   Will fax over confirmation - [phone]

## 2023-10-11 NOTE — Telephone Encounter (Signed)
 Pharmacy Patient Advocate Encounter  Received notification from [facility] that Prior Authorization for LORazepam  1MG  tablets  has been APPROVED from 10/10/23 to 10/09/24   PA #/Case ID/Reference #: [id number]

## 2023-11-06 ENCOUNTER — Other Ambulatory Visit: Payer: Self-pay | Admitting: Family Medicine

## 2023-11-06 DIAGNOSIS — K080 Exfoliation of teeth due to systemic causes: Secondary | ICD-10-CM | POA: Diagnosis not present

## 2023-11-06 DIAGNOSIS — E7800 Pure hypercholesterolemia, unspecified: Secondary | ICD-10-CM

## 2023-11-27 ENCOUNTER — Ambulatory Visit (INDEPENDENT_AMBULATORY_CARE_PROVIDER_SITE_OTHER): Admitting: Family

## 2023-11-27 ENCOUNTER — Encounter: Payer: Self-pay | Admitting: Family

## 2023-11-27 ENCOUNTER — Ambulatory Visit: Payer: Self-pay | Admitting: *Deleted

## 2023-11-27 VITALS — BP 132/68 | HR 89 | Temp 98.7°F | Ht 69.0 in | Wt 139.0 lb

## 2023-11-27 DIAGNOSIS — R1032 Left lower quadrant pain: Secondary | ICD-10-CM | POA: Diagnosis not present

## 2023-11-27 DIAGNOSIS — R300 Dysuria: Secondary | ICD-10-CM

## 2023-11-27 DIAGNOSIS — Z23 Encounter for immunization: Secondary | ICD-10-CM

## 2023-11-27 LAB — POCT URINALYSIS DIPSTICK
Bilirubin, UA: NEGATIVE
Blood, UA: POSITIVE
Glucose, UA: NEGATIVE
Ketones, UA: NEGATIVE
Leukocytes, UA: NEGATIVE
Nitrite, UA: NEGATIVE
Protein, UA: NEGATIVE
Spec Grav, UA: 1.015 (ref 1.010–1.025)
Urobilinogen, UA: 0.2 U/dL
pH, UA: 6 (ref 5.0–8.0)

## 2023-11-27 MED ORDER — NAPROXEN 500 MG PO TABS: 500.0000 mg | ORAL_TABLET | Freq: Two times a day (BID) | ORAL | 0 refills | Status: AC

## 2023-11-27 NOTE — Progress Notes (Signed)
 Patient ID: Shawn Norris, male    DOB: [birth year], 68 y.o.   MRN: [id number]  Chief Complaint  Patient presents with   Abdominal Pain    On the Left side down by belt area; hurts when walking or moving but eases up when goes to the bathroom; dull aching pain; dont feel it much when sitting but when standing hurts; also hurts with pressure; lifted a bag of fertilizer on Sunday dont know if that has something to do with it;   Discussed the use of AI scribe software for clinical note transcription with the patient, who gave verbal consent to proceed.  History of Present Illness   Shawn Norris is a 68 year old male with Crohn's disease who presents with abdominal pain.  Abdominal pain is localized to LLQ  and began after lifting a heavy bag of fertilizer. The pain is immediate, worsens with pressure, and is somewhat relieved after a bowel movement but persists. No pain is present in other areas of the abdomen.  Bowel movements are regular, soft, and occur daily without straining. There are no changes in bowel habits or stool consistency. Crohn's disease typically affects a different abdominal area and has not been bothersome recently. He experiences some morning gas but no increased flatulence or burping. He tried OTC Tums for the pain which has not helped.  Urination is normal with occasional burning. He recalls a past episode of burning during urination treated with antibiotics.     Assessment and Plan    Left lower quadrant abdominal pain Acute pain likely due to gas or musculoskeletal strain. Guarding noted with palpation, no hernia noted. UA negative. - Advised to look for OTC extra strength simethicone (Gas-X) for 2-3 days. - Prescribe naproxen twice daily for a 3-4 days. - Instructions given and also provided handout on avoiding gassy foods, and straws, call back by end of week if no improvement in pain. - Consider ultrasound if no improvement.   Flu vaccine need -  Flu vaccine HIGH DOSE PF(Fluzone Trivalent)  Dysuria - POCT Urinalysis Dipstick   Subjective:    Outpatient Medications Prior to Visit  Medication Sig Dispense Refill   fluticasone  (FLONASE ) 50 MCG/ACT nasal spray SHAKE LIQUID AND USE 2 SPRAYS IN EACH NOSTRIL EVERY DAY 48 g 1   LORazepam  (ATIVAN ) 1 MG tablet Take 1 tablet (1 mg total) by mouth every 8 (eight) hours as needed for anxiety. 90 tablet 5   mupirocin  cream (BACTROBAN ) 2 % Apply 1 Application topically 2 (two) times daily. Apply a thin layer to the rash on your bottom for 7-10days. 15 g 1   simvastatin  (ZOCOR ) 40 MG tablet TAKE 1 TABLET(40 MG) BY MOUTH AT BEDTIME 90 tablet 0   verapamil  (CALAN ) 120 MG tablet TAKE 1 TABLET(120 MG) BY MOUTH TWICE DAILY 180 tablet 1   zolpidem  (AMBIEN ) 10 MG tablet Take 1 tablet (10 mg total) by mouth at bedtime as needed. for sleep 30 tablet 5   No facility-administered medications prior to visit.   Past Medical History:  Diagnosis Date   Allergy    Anxiety    Arthritis    BPH (benign prostatic hypertrophy)    Crohn's    Dysuria    History of PSVT (paroxysmal supraventricular tachycardia)    Hypercholesterolemia    May 2013 - TC 152, TG 48, HDL 60, LDL 82   Hypertension    Inguinal hernia    Insomnia    Palpitations  Prostatitis    Varicocele    Past Surgical History:  Procedure Laterality Date   ACHILLES TENDON REPAIR     INGUINAL HERNIA REPAIR     Allergies  Allergen Reactions   Penicillins Rash      Objective:    Physical Exam Vitals and nursing note reviewed.  Constitutional:      General: He is not in acute distress.    Appearance: Normal appearance.  HENT:     Head: Normocephalic.  Cardiovascular:     Rate and Rhythm: Normal rate and regular rhythm.  Pulmonary:     Effort: Pulmonary effort is normal.     Breath sounds: Normal breath sounds.  Abdominal:     General: There is no distension.     Palpations: There is no mass.     Tenderness: There is  abdominal tenderness in the left lower quadrant. There is guarding. There is no left CVA tenderness or rebound. Negative signs include Murphy's sign and McBurney's sign.  Musculoskeletal:        General: Normal range of motion.     Cervical back: Normal range of motion.  Skin:    General: Skin is warm and dry.  Neurological:     Mental Status: He is alert and oriented to person, place, and time.  Psychiatric:        Mood and Affect: Mood normal.    BP 132/68   Pulse 89   Temp 98.7 F (37.1 C)   Ht 5' 9 (1.753 m)   Wt 139 lb (63 kg)   SpO2 97%   BMI 20.53 kg/m  Wt Readings from Last 3 Encounters:  11/27/23 139 lb (63 kg)  07/02/23 142 lb (64.4 kg)  01/09/23 138 lb 4 oz (62.7 kg)     Lucius Krabbe, NP

## 2023-11-27 NOTE — Telephone Encounter (Signed)
 FYI Only or Action Required?: FYI only for provider.  Patient was last seen in primary care on 07/02/2023 by Kennyth Worth HERO, MD.  Called Nurse Triage reporting Abdominal Pain.  Symptoms began several days ago.  Interventions attempted: Rest, hydration, or home remedies.  Symptoms are: gradually worsening.  Triage Disposition: See HCP Within 4 Hours (Or PCP Triage)  Patient/caregiver understands and will follow disposition?: Yes              Copied from CRM #[id number]. Topic: Clinical - Red Word Triage >> Nov 27, 2023  8:45 AM [place] A wrote: [facility] that prompted transfer to Nurse Triage: Patient is having pain on right side patient does have Crohns Disease. Pain has been for two days Reason for Disposition  [1] MILD-MODERATE pain AND [2] constant AND [3] age > 60 years  Answer Assessment - Initial Assessment Questions Appt today with other provider, none available with PCP.     1. LOCATION: Where does it hurt?      Left abdominal area 2. RADIATION: Does the pain shoot anywhere else? (e.g., chest, back)     no 3. ONSET: When did the pain begin? (Minutes, hours or days ago)      2 days ago Sunday  4. SUDDEN: Gradual or sudden onset?     na 5. PATTERN Does the pain come and go, or is it constant?     Constant  6. SEVERITY: How bad is the pain?  (e.g., Scale 1-10; mild, moderate, or severe)     Moderate  7. RECURRENT SYMPTOM: Have you ever had this type of stomach pain before? If Yes, ask: When was the last time? and What happened that time?      No  8. CAUSE: What do you think is causing the stomach pain? (e.g., gallstones, recent abdominal surgery)     Na  9. RELIEVING/AGGRAVATING FACTORS: What makes it better or worse? (e.g., antacids, bending or twisting motion, bowel movement)     Better after BM  10. OTHER SYMPTOMS: Do you have any other symptoms? (e.g., back pain, diarrhea, fever, urination pain, vomiting)       Left abdominal pain   urinating more than normal picked up fertilizer and felt pain , feels gurgling  Protocols used: Abdominal Pain - Male-A-AH

## 2023-11-27 NOTE — Patient Instructions (Signed)
 It was very nice to see you today!   Your urine is normal, no infection. I believe your pain is due to gas.  This can be due to eating gassy foods: cabbage, eggs, beans, onions, drinking with a straw, etc. Look for generic extra strength Gas-X over the counter. Take per directions on bottle for 2 days. This will push any excess air out via flatulence or burping more which is good. I also am sending Naproxen for possible muscle pain - take this twice a day for 3-4 days.  Call back if pain is not better and I will order an ultrasound.       PLEASE NOTE:  If you had any lab tests please let us  know if you have not heard back within a few days. You may see your results on MyChart before we have a chance to review them but we will give you a call once they are reviewed by us . If we ordered any referrals today, please let us  know if you have not heard from their office within the next week.

## 2023-11-30 ENCOUNTER — Ambulatory Visit: Payer: Self-pay

## 2023-11-30 NOTE — Telephone Encounter (Signed)
 FYI Only or Action Required?: FYI only for provider.  Patient was last seen in primary care on 11/27/2023 by Lucius Krabbe, NP.  Called Nurse Triage reporting Constipation.  Symptoms began 2 days ago.  Interventions attempted: Nothing.  Symptoms are: stable.  Triage Disposition: Home Care  Patient/caregiver understands and will follow disposition?: Yes   Reason for Disposition  Over-The-Counter (OTC) medicines for constipation, questions about  Answer Assessment - Initial Assessment Questions Educated patient on proper hydration and balanced diet as well as some OTC options to help with constipation. Advised patient when to seek medical attention.   1. STOOL PATTERN OR FREQUENCY: How often do you have a bowel movement (BM)?  (Normal range: 3 times a day to every 3 days)  When was your last BM?       Last BM 2-3 days ago  2. STRAINING: Do you have to strain to have a BM?      has the urge to go but doesn't want to strain  3. ONSET: When did the constipation begin?     2 or 3 days ago  4. CHRONIC CONSTIPATION: Is this a new problem for you?  If No, ask: How long have you had this problem? (days, weeks, months)      Has had episodes of constipation before but not a chronic symptom  5. CHANGES IN DIET OR HYDRATION: Have there been any recent changes in your diet? How much fluids are you drinking on a daily basis?  How much have you had to drink today?     Denies  6. MEDICINES: Have you been taking any new medicines? Are you taking any narcotic pain medicines? (e.g., Dilaudid, morphine, Percocet, Vicodin)     Taking Naproxen for LLQ pain, has not taken it in 2 days  7. LAXATIVES: Have you been using any stool softeners, laxatives, or enemas?  If Yes, ask What are you using, how often, and when was the last time?       Takes polyethylene glycol  8. ACTIVITY:  How much walking do you do every day?  Has your activity level decreased in the past  week?        Active, plays golf and walks  9. CAUSE: What do you think is causing the constipation?        Unsure, [place] down to the beach and has not been able to go since being down there  10. MEDICAL HISTORY: Do you have a history of hemorrhoids, rectal fissures, rectal surgery, or rectal abscess?         Crohns  11. OTHER SYMPTOMS: Do you have any other symptoms? (e.g., abdomen pain, bloating, fever, vomiting)       Denies, was seen 10/7 for LLQ pain, but that has subsided  Protocols used: Constipation-A-AH  Message from [place] sent at 11/30/2023  9:41 AM EDT  Reason for Triage: patient was just seen for an appointment 3 days ago, but is now having constipation and has had it for three days. No stomach pain, just not able to go.

## 2023-11-30 NOTE — Telephone Encounter (Signed)
 Noted

## 2023-12-13 ENCOUNTER — Ambulatory Visit (INDEPENDENT_AMBULATORY_CARE_PROVIDER_SITE_OTHER)

## 2023-12-13 VITALS — Ht 65.5 in | Wt 139.0 lb

## 2023-12-13 DIAGNOSIS — Z0000 Encounter for general adult medical examination without abnormal findings: Secondary | ICD-10-CM

## 2023-12-13 NOTE — Patient Instructions (Signed)
 Shawn Norris,  Thank you for taking the time for your Medicare Wellness Visit. I appreciate your continued commitment to your health goals. Please review the care plan we discussed, and feel free to reach out if I can assist you further.  Medicare recommends these wellness visits once per year to help you and your care team stay ahead of potential health issues. These visits are designed to focus on prevention, allowing your provider to concentrate on managing your acute and chronic conditions during your regular appointments.  Please note that Annual Wellness Visits do not include a physical exam. Some assessments may be limited, especially if the visit was conducted virtually. If needed, we may recommend a separate in-person follow-up with your provider.  Ongoing Care Seeing your primary care provider every 3 to 6 months helps us  monitor your health and provide consistent, personalized care.   Referrals If a referral was made during today's visit and you haven't received any updates within two weeks, please contact the referred provider directly to check on the status.  Recommended Screenings:  Health Maintenance  Topic Date Due   Zoster (Shingles) Vaccine (1 of 2) 06/15/1972   DTaP/Tdap/Td vaccine (2 - Td or Tdap) 08/01/2018   Medicare Annual Wellness Visit  12/09/2022   COVID-19 Vaccine (4 - 2025-26 season) 10/22/2023   Colon Cancer Screening  07/29/2024   Pneumococcal Vaccine for age over 68  Completed   Flu Shot  Completed   Hepatitis C Screening  Completed   Meningitis B Vaccine  Aged Out       12/08/2021    9:18 AM  Advanced Directives  Does Patient Have a Medical Advance Directive? Yes  Type of Estate agent of [place];Living will  Copy of Healthcare Power of Attorney in Chart? No - copy requested   Advance Care Planning is important because it: Ensures you receive medical care that aligns with your values, goals, and preferences. Provides guidance  to your family and loved ones, reducing the emotional burden of decision-making during critical moments.  Vision: Annual vision screenings are recommended for early detection of glaucoma, cataracts, and diabetic retinopathy. These exams can also reveal signs of chronic conditions such as diabetes and high blood pressure.  Dental: Annual dental screenings help detect early signs of oral cancer, gum disease, and other conditions linked to overall health, including heart disease and diabetes.  Please see the attached documents for additional preventive care recommendations.

## 2023-12-13 NOTE — Progress Notes (Signed)
 Subjective:   Shawn Norris is a 68 y.o. who presents for a Medicare Wellness preventive visit.  As a reminder, Annual Wellness Visits don't include a physical exam, and some assessments may be limited, especially if this visit is performed virtually. We may recommend an in-person follow-up visit with your provider if needed.  Visit Complete: Virtual I connected with  Shawn Norris on 12/13/23 by a audio enabled telemedicine application and verified that I am speaking with the correct person using two identifiers.  Patient Location: Home  Provider Location: Office/Clinic  I discussed the limitations of evaluation and management by telemedicine. The patient expressed understanding and agreed to proceed.  Vital Signs: Because this visit was a virtual/telehealth visit, some criteria may be missing or patient reported. Any vitals not documented were not able to be obtained and vitals that have been documented are patient reported.  VideoDeclined- This patient declined Librarian, academic. Therefore the visit was completed with audio only.  Persons Participating in Visit: Patient.  AWV Questionnaire: No: Patient Medicare AWV questionnaire was not completed prior to this visit.  Cardiac Risk Factors include: advanced age (>74men, >70 women);dyslipidemia;hypertension     Objective:    Today's Vitals   12/13/23 1210  Weight: 139 lb (63 kg)  Height: 5' 5.5 (1.664 m)   Body mass index is 22.78 kg/m.     12/13/2023   12:15 PM 12/08/2021    9:18 AM 11/25/2020    9:00 AM 08/28/2019    9:11 AM 08/15/2018    9:36 AM 06/25/2014    6:21 PM  Advanced Directives  Does Patient Have a Medical Advance Directive? Yes Yes Yes Yes Yes Yes   Type of Estate agent of [place];Living will Healthcare Power of [place];Living will Healthcare Power of [place] Living will Healthcare Power of [employer];Living will Living will   Does patient want to  make changes to medical advance directive?     No - Patient declined    Copy of Healthcare Power of Attorney in Chart? No - copy requested No - copy requested No - copy requested  No - copy requested  No - copy requested      Data saved with a previous flowsheet row definition    Current Medications (verified) Outpatient Encounter Medications as of 12/13/2023  Medication Sig   fluticasone  (FLONASE ) 50 MCG/ACT nasal spray SHAKE LIQUID AND USE 2 SPRAYS IN EACH NOSTRIL EVERY DAY   LORazepam  (ATIVAN ) 1 MG tablet Take 1 tablet (1 mg total) by mouth every 8 (eight) hours as needed for anxiety.   naproxen (NAPROSYN) 500 MG tablet Take 1 tablet (500 mg total) by mouth 2 (two) times daily with a meal. Take twice a day for pain for 3-4 days, then prn.   simvastatin  (ZOCOR ) 40 MG tablet TAKE 1 TABLET(40 MG) BY MOUTH AT BEDTIME   verapamil  (CALAN ) 120 MG tablet TAKE 1 TABLET(120 MG) BY MOUTH TWICE DAILY   zolpidem  (AMBIEN ) 10 MG tablet Take 1 tablet (10 mg total) by mouth at bedtime as needed. for sleep   [DISCONTINUED] mupirocin  cream (BACTROBAN ) 2 % Apply 1 Application topically 2 (two) times daily. Apply a thin layer to the rash on your bottom for 7-10days.   No facility-administered encounter medications on file as of 12/13/2023.    Allergies (verified) Penicillins   History: Past Medical History:  Diagnosis Date   Allergy    Anxiety    Arthritis    BPH (benign prostatic hypertrophy)  Crohn's    Dysuria    History of PSVT (paroxysmal supraventricular tachycardia)    Hypercholesterolemia    May 2013 - TC 152, TG 48, HDL 60, LDL 82   Hypertension    Inguinal hernia    Insomnia    Palpitations    Prostatitis    Varicocele    Past Surgical History:  Procedure Laterality Date   ACHILLES TENDON REPAIR     INGUINAL HERNIA REPAIR     Family History  Problem Relation Age of Onset   Dementia Mother    Social History   Socioeconomic History   Marital status: Single    Spouse  name: Not on file   Number of children: Not on file   Years of education: Not on file   Highest education level: Not on file  Occupational History   Occupation: Sales   Tobacco Use   Smoking status: Never   Smokeless tobacco: Never  Substance and Sexual Activity   Alcohol use: Yes    Alcohol/week: 4.0 standard drinks of alcohol    Types: 4 Glasses of wine per week   Drug use: No   Sexual activity: Yes  Other Topics Concern   Not on file  Social History Narrative    He is a single healthy gentleman. He is an avid exerciser at the gym with treadmill, and other exercises routinely. He drinks social alcohol but otherwise is stable.    Social Drivers of Corporate investment banker Strain: Low Risk  (12/13/2023)   Overall Financial Resource Strain (CARDIA)    Difficulty of Paying Living Expenses: Not hard at all  Food Insecurity: No Food Insecurity (12/13/2023)   Hunger Vital Sign    Worried About Running Out of Food in the Last Year: Never true    Ran Out of Food in the Last Year: Never true  Transportation Needs: No Transportation Needs (12/13/2023)   PRAPARE - Administrator, Civil Service (Medical): No    Lack of Transportation (Non-Medical): No  Physical Activity: Sufficiently Active (12/13/2023)   Exercise Vital Sign    Days of Exercise per Week: 7 days    Minutes of Exercise per Session: 30 min  Stress: No Stress Concern Present (12/13/2023)   [employer] of Occupational Health - Occupational Stress Questionnaire    Feeling of Stress: Only a little  Social Connections: Socially Isolated (12/13/2023)   Social Connection and Isolation Panel    Frequency of Communication with Friends and Family: More than three times a week    Frequency of Social Gatherings with Friends and Family: More than three times a week    Attends Religious Services: Never    Database administrator or Organizations: No    Attends Engineer, structural: Never    Marital  Status: Never married    Tobacco Counseling Counseling given: Not Answered    Clinical Intake:  Pre-visit preparation completed: Yes  Pain : No/denies pain     BMI - recorded: 22.78 Nutritional Status: BMI of 19-24  Normal Nutritional Risks: None Diabetes: No  Lab Results  Component Value Date   HGBA1C 5.5 12/05/2022   HGBA1C 5.4 09/06/2021   HGBA1C 5.7 08/18/2019     How often do you need to have someone help you when you read instructions, pamphlets, or other written materials from your doctor or pharmacy?: 1 - Never  Interpreter Needed?: No  Information entered by :: Ellouise Haws, LPN   Activities of Daily  Living     12/13/2023   12:13 PM  In your present state of health, do you have any difficulty performing the following activities:  Hearing? 1  Comment slight HOH  Vision? 0  Difficulty concentrating or making decisions? 0  Walking or climbing stairs? 0  Dressing or bathing? 0  Doing errands, shopping? 0  Preparing Food and eating ? N  Using the Toilet? N  In the past six months, have you accidently leaked urine? N  Do you have problems with loss of bowel control? N  Managing your Medications? N  Managing your Finances? N  Housekeeping or managing your Housekeeping? N    Patient Care Team: Kennyth Worth HERO, MD as PCP - General (Family Medicine) Watt Rush, MD as Consulting Physician (Urology) Rollin Dover, MD as Consulting Physician (Gastroenterology)  I have updated your Care Teams any recent Medical Services you may have received from other providers in the past year.     Assessment:   This is a routine wellness examination for [place].  Hearing/Vision screen Hearing Screening - Comments:: Slight HOH  Vision Screening - Comments:: Wears rx glasses - up to date with routine eye exams with Dr  Bobbetta    Goals Addressed             This Visit's Progress    Patient Stated       Maintain health and activity        Depression Screen      12/13/2023   12:16 PM 11/27/2023   10:59 AM 07/02/2023    1:38 PM 12/05/2022    9:52 AM 12/08/2021    9:14 AM 09/06/2021    1:43 PM 11/25/2020    8:57 AM  PHQ 2/9 Scores  PHQ - 2 Score 1 0 2 0 1 1 0  PHQ- 9 Score  0 5 0  6     Fall Risk     12/13/2023   12:18 PM 07/02/2023    1:38 PM 12/05/2022    9:52 AM 12/08/2021    9:19 AM 09/06/2021    1:00 PM  Fall Risk   Falls in the past year? 0 0 0 0 0  Number falls in past yr: 0 0 0 0 0  Injury with Fall? 0 0 0 0 0  Risk for fall due to : No Fall Risks No Fall Risks No Fall Risks Impaired vision No Fall Risks  Follow up Falls prevention discussed   Falls prevention discussed       Data saved with a previous flowsheet row definition    MEDICARE RISK AT HOME:  Medicare Risk at Home Any stairs in or around the home?: Yes If so, are there any without handrails?: No Home free of loose throw rugs in walkways, pet beds, electrical cords, etc?: Yes Adequate lighting in your home to reduce risk of falls?: Yes Life alert?: No Use of a cane, walker or w/c?: No Grab bars in the bathroom?: No Shower chair or bench in shower?: No Elevated toilet seat or a handicapped toilet?: No  TIMED UP AND GO:  Was the test performed?  No  Cognitive Function: 6CIT completed        12/13/2023   12:19 PM 12/08/2021    9:20 AM 11/25/2020    9:02 AM 08/28/2019    9:21 AM  6CIT Screen  What Year? 0 points 0 points 0 points 0 points  What month? 0 points 0 points 0 points 0 points  What time? 0 points 0 points 0 points 0 points  Count back from 20 0 points 0 points 0 points 0 points  Months in reverse 0 points 0 points 0 points 0 points  Repeat phrase 0 points 2 points 0 points 0 points  Total Score 0 points 2 points 0 points 0 points    Immunizations Immunization History  Administered Date(s) Administered   Fluad Quad(high Dose 65+) 10/08/2018, 12/06/2020   Fluad Trivalent(High Dose 65+) 12/05/2022   INFLUENZA, HIGH DOSE SEASONAL PF  11/27/2023   Influenza Inj Mdck Quad Pf 01/12/2018   Influenza Split 12/19/2010, 11/27/2011   Influenza,inj,Quad PF,6+ Mos 11/25/2012, 11/21/2013, 12/07/2014, 11/23/2016, 12/18/2019   PFIZER(Purple Top)SARS-COV-2 Vaccination 03/18/2019, 04/07/2019, 11/20/2019   Pneumococcal Conjugate-13 08/15/2018   Pneumococcal Polysaccharide-23 09/06/2021   Tdap 07/31/2008   Zoster, Live 12/19/2010    Screening Tests Health Maintenance  Topic Date Due   Zoster Vaccines- Shingrix (1 of 2) 06/15/1972   DTaP/Tdap/Td (2 - Td or Tdap) 08/01/2018   COVID-19 Vaccine (4 - 2025-26 season) 10/22/2023   Colonoscopy  07/29/2024   Medicare Annual Wellness (AWV)  12/12/2024   Pneumococcal Vaccine: 50+ Years  Completed   Influenza Vaccine  Completed   Hepatitis C Screening  Completed   Meningococcal B Vaccine  Aged Out    Health Maintenance Items Addressed: See Nurse Notes at the end of this note  Additional Screening:  Vision Screening: Recommended annual ophthalmology exams for early detection of glaucoma and other disorders of the eye. Is the patient up to date with their annual eye exam?  Yes  Who is the provider or what is the name of the office in which the patient attends annual eye exams? Dr Bobbetta   Dental Screening: Recommended annual dental exams for proper oral hygiene  Community Resource Referral / Chronic Care Management: CRR required this visit?  No   CCM required this visit?  No   Plan:    I have personally reviewed and noted the following in the patient's chart:   Medical and social history Use of alcohol, tobacco or illicit drugs  Current medications and supplements including opioid prescriptions. Patient is not currently taking opioid prescriptions. Functional ability and status Nutritional status Physical activity Advanced directives List of other physicians Hospitalizations, surgeries, and ER visits in previous 12 months Vitals Screenings to include cognitive,  depression, and falls Referrals and appointments  In addition, I have reviewed and discussed with patient certain preventive protocols, quality metrics, and best practice recommendations. A written personalized care plan for preventive services as well as general preventive health recommendations were provided to patient.   Ellouise VEAR Haws, LPN   [id number]   After Visit Summary: (MyChart) Due to this being a telephonic visit, the after visit summary with patients personalized plan was offered to patient via MyChart   Notes: Nothing significant to report at this time.

## 2023-12-24 ENCOUNTER — Encounter: Admitting: Family Medicine

## 2024-01-03 ENCOUNTER — Other Ambulatory Visit: Payer: Self-pay | Admitting: Family Medicine

## 2024-01-29 ENCOUNTER — Telehealth: Payer: Self-pay | Admitting: Family Medicine

## 2024-01-29 NOTE — Telephone Encounter (Signed)
 Patient had CPE scheduled for 12/24/23 but had to reschedule due to PCP being out. Patient got rescheduled to 02/05/24 but has to reschedule again due to PCP being out of office. Can patient be overbooked for CPE? Please advise.

## 2024-01-29 NOTE — Telephone Encounter (Signed)
 Please review notes and call patient for appointment.

## 2024-01-29 NOTE — Telephone Encounter (Signed)
 Yes that is fine

## 2024-02-05 ENCOUNTER — Encounter: Admitting: Family Medicine

## 2024-02-17 ENCOUNTER — Other Ambulatory Visit: Payer: Self-pay | Admitting: Family Medicine

## 2024-02-17 DIAGNOSIS — I10 Essential (primary) hypertension: Secondary | ICD-10-CM

## 2024-02-17 DIAGNOSIS — E7800 Pure hypercholesterolemia, unspecified: Secondary | ICD-10-CM

## 2024-02-17 DIAGNOSIS — Z8679 Personal history of other diseases of the circulatory system: Secondary | ICD-10-CM

## 2024-02-28 ENCOUNTER — Ambulatory Visit (INDEPENDENT_AMBULATORY_CARE_PROVIDER_SITE_OTHER): Admitting: Family Medicine

## 2024-02-28 ENCOUNTER — Encounter: Payer: Self-pay | Admitting: Family Medicine

## 2024-02-28 VITALS — BP 122/80 | HR 65 | Temp 98.1°F | Ht 65.5 in | Wt 140.4 lb

## 2024-02-28 DIAGNOSIS — K50019 Crohn's disease of small intestine with unspecified complications: Secondary | ICD-10-CM | POA: Diagnosis not present

## 2024-02-28 DIAGNOSIS — G4700 Insomnia, unspecified: Secondary | ICD-10-CM

## 2024-02-28 DIAGNOSIS — Z0000 Encounter for general adult medical examination without abnormal findings: Secondary | ICD-10-CM | POA: Diagnosis not present

## 2024-02-28 DIAGNOSIS — E785 Hyperlipidemia, unspecified: Secondary | ICD-10-CM | POA: Diagnosis not present

## 2024-02-28 DIAGNOSIS — R739 Hyperglycemia, unspecified: Secondary | ICD-10-CM

## 2024-02-28 DIAGNOSIS — F410 Panic disorder [episodic paroxysmal anxiety] without agoraphobia: Secondary | ICD-10-CM

## 2024-02-28 DIAGNOSIS — I10 Essential (primary) hypertension: Secondary | ICD-10-CM | POA: Diagnosis not present

## 2024-02-28 DIAGNOSIS — N400 Enlarged prostate without lower urinary tract symptoms: Secondary | ICD-10-CM

## 2024-02-28 DIAGNOSIS — F341 Dysthymic disorder: Secondary | ICD-10-CM

## 2024-02-28 LAB — LIPID PANEL
Cholesterol: 141 mg/dL (ref 28–200)
HDL: 68.9 mg/dL
LDL Cholesterol: 58 mg/dL (ref 10–99)
NonHDL: 71.92
Total CHOL/HDL Ratio: 2
Triglycerides: 70 mg/dL (ref 10.0–149.0)
VLDL: 14 mg/dL (ref 0.0–40.0)

## 2024-02-28 LAB — CBC
HCT: 41.6 % (ref 39.0–52.0)
Hemoglobin: 14 g/dL (ref 13.0–17.0)
MCHC: 33.7 g/dL (ref 30.0–36.0)
MCV: 90.5 fl (ref 78.0–100.0)
Platelets: 220 K/uL (ref 150.0–400.0)
RBC: 4.59 Mil/uL (ref 4.22–5.81)
RDW: 13.3 % (ref 11.5–15.5)
WBC: 5.8 K/uL (ref 4.0–10.5)

## 2024-02-28 LAB — COMPREHENSIVE METABOLIC PANEL WITH GFR
ALT: 14 U/L (ref 3–53)
AST: 15 U/L (ref 5–37)
Albumin: 4.1 g/dL (ref 3.5–5.2)
Alkaline Phosphatase: 37 U/L — ABNORMAL LOW (ref 39–117)
BUN: 20 mg/dL (ref 6–23)
CO2: 30 meq/L (ref 19–32)
Calcium: 9.1 mg/dL (ref 8.4–10.5)
Chloride: 102 meq/L (ref 96–112)
Creatinine, Ser: 1.04 mg/dL (ref 0.40–1.50)
GFR: 72.65 mL/min
Glucose, Bld: 85 mg/dL (ref 70–99)
Potassium: 3.8 meq/L (ref 3.5–5.1)
Sodium: 139 meq/L (ref 135–145)
Total Bilirubin: 0.5 mg/dL (ref 0.2–1.2)
Total Protein: 6.8 g/dL (ref 6.0–8.3)

## 2024-02-28 LAB — TSH: TSH: 1.44 u[IU]/mL (ref 0.35–5.50)

## 2024-02-28 LAB — VITAMIN B12: Vitamin B-12: 227 pg/mL (ref 211–911)

## 2024-02-28 LAB — PSA: PSA: 1.49 ng/mL (ref 0.10–4.00)

## 2024-02-28 LAB — HEMOGLOBIN A1C: Hgb A1c MFr Bld: 5.5 % (ref 4.6–6.5)

## 2024-02-28 MED ORDER — AZITHROMYCIN 250 MG PO TABS: ORAL_TABLET | ORAL | 0 refills | Status: AC

## 2024-02-28 NOTE — Assessment & Plan Note (Signed)
Stable on Ambien 10 mg nightly as needed. 

## 2024-02-28 NOTE — Assessment & Plan Note (Signed)
Check PSA with labs. 

## 2024-02-28 NOTE — Assessment & Plan Note (Signed)
 Blood pressure at goal today on verapamil  120 mg daily.

## 2024-02-28 NOTE — Patient Instructions (Addendum)
 It was very nice to see you today!  VISIT SUMMARY: You came in today with symptoms of a head cold and sinus pressure, and we discussed your ongoing management of Crohn's disease, blood pressure, anxiety, and sleep issues. We also reviewed your general health maintenance, including upcoming screenings and vaccinations.  YOUR PLAN: ACUTE SINUSITIS: You have symptoms of a sinus infection, including nasal congestion, yellow nasal discharge, and head pressure. -You have been prescribed a Z-Pak (azithromycin ) to treat the sinus infection.  CROHN'S DISEASE: Your Crohn's disease is well-managed with occasional mild symptoms. -A colonoscopy is scheduled for this year to monitor your condition.  PRIMARY HYPERTENSION: Your blood pressure is well-controlled with your current medication regimen. -Continue with your current antihypertensive medication.  PANIC DISORDER: You experience anxiety related to recent life changes, including retirement. -Continue using Ativan  as needed for anxiety management.  INSOMNIA: You have difficulty sleeping, particularly in the mornings, related to anxiety and life changes. -We will continue to monitor this issue and address it as needed.  GENERAL HEALTH MAINTENANCE: We discussed routine health maintenance, including vaccinations and screenings. -Your flu and pneumonia shots are up to date. -Blood work was ordered to check your cholesterol, sugar, kidney, liver, and electrolytes. -Ensure your colonoscopy is scheduled and completed.  Return in about 1 year (around 02/27/2025) for Annual Physical.   Take care, Dr Kennyth  PLEASE NOTE:  If you had any lab tests, please let us  know if you have not heard back within a few days. You may see your results on mychart before we have a chance to review them but we will give you a call once they are reviewed by us .   If we ordered any referrals today, please let us  know if you have not heard from their office within the next  week.   If you had any urgent prescriptions sent in today, please check with the pharmacy within an hour of our visit to make sure the prescription was transmitted appropriately.   Please try these tips to maintain a healthy lifestyle:  Eat at least 3 REAL meals and 1-2 snacks per day.  Aim for no more than 5 hours between eating.  If you eat breakfast, please do so within one hour of getting up.   Each meal should contain half fruits/vegetables, one quarter protein, and one quarter carbs (no bigger than a computer mouse)  Cut down on sweet beverages. This includes juice, soda, and sweet tea.   Drink at least 1 glass of water with each meal and aim for at least 8 glasses per day  Exercise at least 150 minutes every week.    Preventive Care 25 Years and Older, Male Preventive care refers to lifestyle choices and visits with your health care provider that can promote health and wellness. Preventive care visits are also called wellness exams. What can I expect for my preventive care visit? Counseling During your preventive care visit, your health care provider may ask about your: Medical history, including: Past medical problems. Family medical history. History of falls. Current health, including: Emotional well-being. Home life and relationship well-being. Sexual activity. Memory and ability to understand (cognition). Lifestyle, including: Alcohol, nicotine or tobacco, and drug use. Access to firearms. Diet, exercise, and sleep habits. Work and work astronomer. Sunscreen use. Safety issues such as seatbelt and bike helmet use. Physical exam Your health care provider will check your: Height and weight. These may be used to calculate your BMI (body mass index). BMI is a measurement  that tells if you are at a healthy weight. Waist circumference. This measures the distance around your waistline. This measurement also tells if you are at a healthy weight and may help predict your  risk of certain diseases, such as type 2 diabetes and high blood pressure. Heart rate and blood pressure. Body temperature. Skin for abnormal spots. What immunizations do I need?  Vaccines are usually given at various ages, according to a schedule. Your health care provider will recommend vaccines for you based on your age, medical history, and lifestyle or other factors, such as travel or where you work. What tests do I need? Screening Your health care provider may recommend screening tests for certain conditions. This may include: Lipid and cholesterol levels. Diabetes screening. This is done by checking your blood sugar (glucose) after you have not eaten for a while (fasting). Hepatitis C test. Hepatitis B test. HIV (human immunodeficiency virus) test. STI (sexually transmitted infection) testing, if you are at risk. Lung cancer screening. Colorectal cancer screening. Prostate cancer screening. Abdominal aortic aneurysm (AAA) screening. You may need this if you are a current or former smoker. Talk with your health care provider about your test results, treatment options, and if necessary, the need for more tests. Follow these instructions at home: Eating and drinking  Eat a diet that includes fresh fruits and vegetables, whole grains, lean protein, and low-fat dairy products. Limit your intake of foods with high amounts of sugar, saturated fats, and salt. Take vitamin and mineral supplements as recommended by your health care provider. Do not drink alcohol if your health care provider tells you not to drink. If you drink alcohol: Limit how much you have to 0-2 drinks a day. Know how much alcohol is in your drink. In the [place] one drink equals one 12 oz bottle of beer (355 mL), one 5 oz glass of wine (148 mL), or one 1 oz glass of hard liquor (44 mL). Lifestyle Brush your teeth every morning and night with fluoride toothpaste. Floss one time each day. Exercise for at least 30  minutes 5 or more days each week. Do not use any products that contain nicotine or tobacco. These products include cigarettes, chewing tobacco, and vaping devices, such as e-cigarettes. If you need help quitting, ask your health care provider. Do not use drugs. If you are sexually active, practice safe sex. Use a condom or other form of protection to prevent STIs. Take aspirin only as told by your health care provider. Make sure that you understand how much to take and what form to take. Work with your health care provider to find out whether it is safe and beneficial for you to take aspirin daily. Ask your health care provider if you need to take a cholesterol-lowering medicine (statin). Find healthy ways to manage stress, such as: Meditation, yoga, or listening to music. Journaling. Talking to a trusted person. Spending time with friends and family. Safety Always wear your seat belt while driving or riding in a vehicle. Do not drive: If you have been drinking alcohol. Do not ride with someone who has been drinking. When you are tired or distracted. While texting. If you have been using any mind-altering substances or drugs. Wear a helmet and other protective equipment during sports activities. If you have firearms in your house, make sure you follow all gun safety procedures. Minimize exposure to UV radiation to reduce your risk of skin cancer. What's next? Visit your health care provider once a year for  an annual wellness visit. Ask your health care provider how often you should have your eyes and teeth checked. Stay up to date on all vaccines. This information is not intended to replace advice given to you by your health care provider. Make sure you discuss any questions you have with your health care provider. Document Revised: 08/04/2020 Document Reviewed: 08/04/2020 Elsevier Patient Education  2024 [employer].

## 2024-02-28 NOTE — Assessment & Plan Note (Signed)
 He has been a little bit more stress recently due to being let go from his job.  Overall symptoms are manageable and he is trying to find new activities to give him more structure in his life.  He is seeing a therapist which is working well for him.  Does have Ativan  to use as needed though does not need a refill today.

## 2024-02-28 NOTE — Progress Notes (Signed)
 "  Chief Complaint:  Shawn Norris is a 69 y.o. male who presents today for his annual comprehensive physical exam.    Assessment/Plan:  New/Acute Problems: Sinusitis  No red flags though given length of symptoms would be reasonable for us  to treat with a course of antibiotics.  Will start azithromycin  due to penicillin allergy.  Encouraged hydration.  He can use over-the-counter meds as needed.  We discussed reasons to return to care.  Follow-up as needed.  Chronic Problems Addressed Today: Hyperlipidemia LDL goal <100 On simvastatin  40 mg daily.  Tolerating well.  Check lipids with labs.  Crohn's disease (HCC) Doing well without any recent flares though needs colonoscopy later this year.  He will let us  know if he has not heard from GI about getting this scheduled.  BPH (benign prostatic hyperplasia) Check PSA with labs.  Dysthymia He has been a little bit more stress recently due to being let go from his job.  Overall symptoms are manageable and he is trying to find new activities to give him more structure in his life.  He is seeing a therapist which is working well for him.  Does have Ativan  to use as needed though does not need a refill today.  Insomnia Stable on Ambien  10 mg nightly as needed.  Hypertension Blood pressure at goal today on verapamil  120 mg daily.  Panic attacks Stable on Ativan  as needed.  Working with a paramedic as well which is going well.    Preventative Healthcare: Check labs.  Due for colonoscopy later this year.  Patient Counseling(The following topics were reviewed and/or handout was given):  -Nutrition: Stressed importance of moderation in sodium/caffeine intake, saturated fat and cholesterol, caloric balance, sufficient intake of fresh fruits, vegetables, and fiber.  -Stressed the importance of regular exercise.   -Substance Abuse: Discussed cessation/primary prevention of tobacco, alcohol, or other drug use; driving or other dangerous  activities under the influence; availability of treatment for abuse.   -Injury prevention: Discussed safety belts, safety helmets, smoke detector, smoking near bedding or upholstery.   -Sexuality: Discussed sexually transmitted diseases, partner selection, use of condoms, avoidance of unintended pregnancy and contraceptive alternatives.   -Dental health: Discussed importance of regular tooth brushing, flossing, and dental visits.  -Health maintenance and immunizations reviewed. Please refer to Health maintenance section.  Return to care in 1 year for next preventative visit.     Subjective:  HPI:  He has no acute complaints today. Patient is here today for his annual physical.  See assessment / plan for status of chronic conditions.  Discussed the use of AI scribe software for clinical note transcription with the patient, who gave verbal consent to proceed.  History of Present Illness Shawn Norris Shawn Norris is a 69 year old male with Crohn's disease who presents with symptoms of a head cold and sinus pressure.  He has been experiencing symptoms of a head cold for about a week, including nasal congestion and a raspy voice. The nasal discharge is mostly clear with a slight yellow tint. He frequently blows his nose and feels pressure in his head, which he associates with a sinus infection. Initially, he had a cough that has since subsided. No chest-related symptoms are present.  He discusses his recent involuntary retirement and the associated anxiety, experiencing a racing heart and anxiety upon waking. He uses Ativan  as needed for anxiety management. He is considering engaging in volunteer work and other activities to stay active and involved.  He maintains  a routine of daily walking for exercise and stays informed about the stock market. He drinks a glass of wine every night and socializes with friends at a local grill. He acknowledges that he might drink more than recommended but values  the social aspect.  He has a history of Crohn's disease, which is currently well-managed without medication. He experiences occasional symptoms that resolve within a few hours. He previously took azathioprine  but discontinued it due to potential risks. He is due for a colonoscopy this year.  He takes simvastatin  and has recently discontinued lisinopril  HCTZ for blood pressure management.      02/28/2024    1:45 PM  Depression screen PHQ 2/9  Decreased Interest 0  Down, Depressed, Hopeless 0  PHQ - 2 Score 0    Health Maintenance Due  Topic Date Due   Zoster Vaccines- Shingrix (1 of 2) 06/15/1972   DTaP/Tdap/Td (2 - Td or Tdap) 08/01/2018   COVID-19 Vaccine (4 - 2025-26 season) 10/22/2023     ROS: Per HPI, otherwise a complete review of systems was negative.   PMH:  The following were reviewed and entered/updated in epic: Past Medical History:  Diagnosis Date   Allergy    Anxiety    Arthritis    BPH (benign prostatic hypertrophy)    Crohn's    Dysuria    History of PSVT (paroxysmal supraventricular tachycardia)    Hypercholesterolemia    May 2013 - TC 152, TG 48, HDL 60, LDL 82   Hypertension    Inguinal hernia    Insomnia    Palpitations    Prostatitis    Varicocele    Patient Active Problem List   Diagnosis Date Noted   Actinic keratosis 08/18/2019   BPH (benign prostatic hyperplasia) 05/14/2019   Panic attacks 01/28/2019   Insomnia 01/28/2019   PSVT (paroxysmal supraventricular tachycardia) 08/15/2018   Allergic rhinitis 06/17/2013   Dysthymia 06/17/2013   Actinic keratosis of scalp 01/08/2013   Hypertension 07/03/2011   Hyperlipidemia LDL goal <100 07/03/2011   Crohn's disease (HCC) 07/03/2011   Past Surgical History:  Procedure Laterality Date   ACHILLES TENDON REPAIR     INGUINAL HERNIA REPAIR      Family History  Problem Relation Age of Onset   Dementia Mother     Medications- reviewed and updated Current Outpatient Medications  Medication  Sig Dispense Refill   azithromycin  (ZITHROMAX ) 250 MG tablet Take 2 tabs day 1, then 1 tab daily 6 each 0   fluticasone  (FLONASE ) 50 MCG/ACT nasal spray SHAKE LIQUID AND USE 2 SPRAYS IN EACH NOSTRIL EVERY DAY 48 g 1   LORazepam  (ATIVAN ) 1 MG tablet Take 1 tablet (1 mg total) by mouth every 8 (eight) hours as needed for anxiety. 90 tablet 5   naproxen  (NAPROSYN ) 500 MG tablet Take 1 tablet (500 mg total) by mouth 2 (two) times daily with a meal. Take twice a day for pain for 3-4 days, then prn. 20 tablet 0   simvastatin  (ZOCOR ) 40 MG tablet TAKE 1 TABLET(40 MG) BY MOUTH AT BEDTIME 90 tablet 0   verapamil  (CALAN ) 120 MG tablet TAKE 1 TABLET(120 MG) BY MOUTH TWICE DAILY 180 tablet 1   zolpidem  (AMBIEN ) 10 MG tablet TAKE 1 TABLET(10 MG) BY MOUTH AT BEDTIME AS NEEDED FOR SLEEP 30 tablet 5   No current facility-administered medications for this visit.    Allergies-reviewed and updated Allergies[1]  Social History   Socioeconomic History   Marital status: Single  Spouse name: Not on file   Number of children: Not on file   Years of education: Not on file   Highest education level: Not on file  Occupational History   Occupation: Sales   Tobacco Use   Smoking status: Never   Smokeless tobacco: Never  Substance and Sexual Activity   Alcohol use: Yes    Alcohol/week: 4.0 standard drinks of alcohol    Types: 4 Glasses of wine per week   Drug use: No   Sexual activity: Yes  Other Topics Concern   Not on file  Social History Narrative    He is a single healthy gentleman. He is an avid exerciser at the gym with treadmill, and other exercises routinely. He drinks social alcohol but otherwise is stable.    Social Drivers of Health   Tobacco Use: Low Risk (02/28/2024)   Patient History    Smoking Tobacco Use: Never    Smokeless Tobacco Use: Never    Passive Exposure: Not on file  Financial Resource Strain: Low Risk (12/13/2023)   Overall Financial Resource Strain (CARDIA)    Difficulty  of Paying Living Expenses: Not hard at all  Food Insecurity: No Food Insecurity (12/13/2023)   Epic    Worried About Programme Researcher, Broadcasting/film/video in the Last Year: Never true    Ran Out of Food in the Last Year: Never true  Transportation Needs: No Transportation Needs (12/13/2023)   Epic    Lack of Transportation (Medical): No    Lack of Transportation (Non-Medical): No  Physical Activity: Sufficiently Active (12/13/2023)   Exercise Vital Sign    Days of Exercise per Week: 7 days    Minutes of Exercise per Session: 30 min  Stress: No Stress Concern Present (12/13/2023)   [employer] of Occupational Health - Occupational Stress Questionnaire    Feeling of Stress: Only a little  Social Connections: Socially Isolated (12/13/2023)   Social Connection and Isolation Panel    Frequency of Communication with Friends and Family: More than three times a week    Frequency of Social Gatherings with Friends and Family: More than three times a week    Attends Religious Services: Never    Database Administrator or Organizations: No    Attends Banker Meetings: Never    Marital Status: Never married  Depression (PHQ2-9): Low Risk (02/28/2024)   Depression (PHQ2-9)    PHQ-2 Score: 0  Alcohol Screen: Low Risk (12/13/2023)   Alcohol Screen    Last Alcohol Screening Score (AUDIT): 4  Housing: Unknown (12/13/2023)   Epic    Unable to Pay for Housing in the Last Year: No    Number of Times Moved in the Last Year: Not on file    Homeless in the Last Year: No  Utilities: Not At Risk (12/13/2023)   Epic    Threatened with loss of utilities: No  Health Literacy: Adequate Health Literacy (12/13/2023)   B1300 Health Literacy    Frequency of need for help with medical instructions: Never        Objective:  Physical Exam: BP 122/80   Pulse 65   Temp 98.1 F (36.7 C) (Temporal)   Ht 5' 5.5 (1.664 m)   Wt 140 lb 6.4 oz (63.7 kg)   SpO2 98%   BMI 23.01 kg/m   Body mass index is 23.01  kg/m. Wt Readings from Last 3 Encounters:  02/28/24 140 lb 6.4 oz (63.7 kg)  12/13/23 139 lb (63 kg)  11/27/23 139 lb (63 kg)   Gen: NAD, resting comfortably HEENT: TMs normal bilaterally. OP clear. No thyromegaly noted.  CV: RRR with no murmurs appreciated Pulm: NWOB, CTAB with no crackles, wheezes, or rhonchi GI: Normal bowel sounds present. Soft, Nontender, Nondistended. MSK: no edema, cyanosis, or clubbing noted Skin: warm, dry Neuro: CN2-12 grossly intact. Strength 5/5 in upper and lower extremities. Reflexes symmetric and intact bilaterally.  Psych: Normal affect and thought content     Shallyn Constancio M. Kennyth, MD 02/28/2024 2:23 PM     [1]  Allergies Allergen Reactions   Penicillins Rash   "

## 2024-02-28 NOTE — Assessment & Plan Note (Signed)
 On simvastatin  40 mg daily.  Tolerating well.  Check lipids with labs.

## 2024-02-28 NOTE — Assessment & Plan Note (Signed)
 Doing well without any recent flares though needs colonoscopy later this year.  He will let us  know if he has not heard from GI about getting this scheduled.

## 2024-02-28 NOTE — Assessment & Plan Note (Signed)
 Stable on Ativan  as needed.  Working with a paramedic as well which is going well.

## 2024-02-28 NOTE — Addendum Note (Signed)
 Addended by: EMALINE ELMS on: 02/28/2024 02:37 PM   Modules accepted: Orders

## 2024-02-29 ENCOUNTER — Ambulatory Visit: Payer: Self-pay | Admitting: Family Medicine

## 2024-02-29 NOTE — Progress Notes (Signed)
 Good news! Labs are all at goal.  Do not need to make any adjustments to treatment plan at this time.  He should keep up the great work and we can recheck everything in a year

## 2024-03-10 ENCOUNTER — Encounter: Admitting: Family Medicine

## 2024-12-24 ENCOUNTER — Ambulatory Visit

## 2025-03-02 ENCOUNTER — Encounter: Admitting: Family Medicine
